# Patient Record
Sex: Male | Born: 2000 | Race: Black or African American | Hispanic: No | Marital: Single | Smoking: Never smoker
Health system: Southern US, Community
[De-identification: ages and names within clinical notes are randomized; demographics above are authoritative.]

## PROBLEM LIST (undated history)

## (undated) ENCOUNTER — Emergency Department (HOSPITAL_COMMUNITY): Discharge status: Against Medical Advice | Payer: Medicaid Other

## (undated) DIAGNOSIS — F840 Autistic disorder: Secondary | ICD-10-CM

## (undated) DIAGNOSIS — F909 Attention-deficit hyperactivity disorder, unspecified type: Secondary | ICD-10-CM

## (undated) HISTORY — PX: DENTAL SURGERY: SHX609

## (undated) HISTORY — PX: INNER EAR SURGERY: SHX679

---
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
## 2020-01-08 ENCOUNTER — Telehealth (INDEPENDENT_AMBULATORY_CARE_PROVIDER_SITE_OTHER): Payer: Medicaid Other | Admitting: Family

## 2020-01-08 DIAGNOSIS — F70 Mild intellectual disabilities: Secondary | ICD-10-CM

## 2020-01-08 DIAGNOSIS — F902 Attention-deficit hyperactivity disorder, combined type: Secondary | ICD-10-CM

## 2020-01-08 DIAGNOSIS — R634 Abnormal weight loss: Secondary | ICD-10-CM | POA: Diagnosis not present

## 2020-01-08 DIAGNOSIS — T50905A Adverse effect of unspecified drugs, medicaments and biological substances, initial encounter: Secondary | ICD-10-CM

## 2020-01-08 DIAGNOSIS — R4789 Other speech disturbances: Secondary | ICD-10-CM | POA: Diagnosis not present

## 2020-01-08 MED ORDER — GUANFACINE HCL ER 2 MG PO TB24: 2.0000 mg | ORAL_TABLET | Freq: Every day | ORAL | 2 refills | Status: DC

## 2020-01-08 MED ORDER — AMPHETAMINE-DEXTROAMPHET ER 20 MG PO CP24: 20.0000 mg | ORAL_CAPSULE | Freq: Every day | ORAL | 0 refills | Status: DC

## 2020-01-08 NOTE — Progress Notes (Signed)
THIS RECORD MAY CONTAIN CONFIDENTIAL INFORMATION THAT SHOULD NOT BE RELEASED WITHOUT REVIEW OF THE SERVICE PROVIDER.  Virtual Follow-Up Visit via Video Note  I connected with Derrick Swanson 's mother and patient  on 01/08/20 at 10:00 AM EST by a video enabled telemedicine application and verified that I am speaking with the correct person using two identifiers.   Patient/parent location: home   I discussed the limitations of evaluation and management by telemedicine and the availability of in person appointments.  I discussed that the purpose of this telehealth visit is to provide medical care while limiting exposure to the novel coronavirus.  The mother expressed understanding and agreed to proceed.   Derrick Swanson is a 19 y.o. male referred by Roselind Messier, MD here today for follow-up of ADHD.  Previsit planning completed:  yes   History was provided by the patient and mother.  Plan from Last Visit:   ADHD - adderall XR 69m weekdays. Intuniv 232mqd. Follow weight loss. New job via [employer]) Mild ID - services available.  Chief Complaint: ADHD follow up  History of Present Illness:   Connected with patient and mother Mrs WoCruz CondonADHD  - Continues to take adderall XR weekdays, Intuniv daily.  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 Mother had IEP meeting recently -- will follow up on his focus at internship. - Weight -- subjectively gaining weight (no scale), pants fitting better / increasing in size. Appetite is pretty good at night, eats well on weekends. Mornings eats grits, rice, bacon. 3 chicken tenders at lunch. Eats again at 4-5pm, again at 9pm. - Sleep: 11pm-6am. No trouble falling asleep or staying asleep. - No chest pain, heart racing, lightheadedness.  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 No complaints. - Speech class on Wedn  Scheduled to  get COVID shot soon, date unsure. Discussed SEs with mother.  ROS:  negative  Allergies  Allergen Reactions  . Amoxicillin Hives   Outpatient Medications Prior to Visit  Medication Sig Dispense Refill  . amphetamine-dextroamphetamine (ADDERALL XR) 20 MG 24 hr capsule Take 1 capsule (20 mg total) by mouth daily. qam 30 capsule 0  . guanFACINE (INTUNIV) 2 MG TB24 ER tablet Take 1 tablet (2 mg total) by mouth daily. 31 tablet 2   No facility-administered medications prior to visit.     Patient Active Problem List   Diagnosis Date Noted  . Poor weight gain (0-17) 07/30/2019  . Dysfluency 09/12/2013  . ADHD (attention deficit hyperactivity disorder), combined type 06/07/2013  . Mild intellectual disability 06/07/2013  . Receptive-expressive language delay 06/07/2013  . Weight loss due to medication 06/07/2013    Visual Observations/Objective:  General Appearance: Well nourished well developed, in no apparent distress.  Eyes: conjunctiva no swelling or erythema ENT/Mouth: No hoarseness, No cough for duration of visit.  Neck: Supple  Respiratory: Respiratory effort normal, normal rate, no retractions or distress.   Cardio: Appears well-perfused, noncyanotic Musculoskeletal: no obvious deformity Skin: visible skin without rashes, ecchymosis, erythema Neuro: Awake and oriented X 3,  Psych:  normal affect, Insight and Judgment appropriate.   Assessment/Plan:  1. ADHD (attention deficit hyperactivity disorder), combined type - amphetamine-dextroamphetamine (ADDERALL XR) 20 MG 24 hr capsule; Take 1 capsule (20 mg total) by mouth daily. qam  Dispense: 30 capsule; Refill: 0 - guanFACINE (INTUNIV) 2 MG TB24 ER tablet; Take 1 tablet (2 mg total) by  mouth daily.  Dispense: 31 tablet; Refill: 2  2. Dysfluency Receiving Speech therapy once weekly.  3. 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 Followed by Dr [name].  4. Weight loss due  to medication Subjective weight gain. No scale at home. Plan for in-office visit in 3 months for weight check.    I discussed the assessment and treatment plan with the patient and/or parent/guardian.  They were provided an opportunity to ask questions and all were answered.  They agreed with the plan and demonstrated an understanding of the instructions. They were advised to call back or seek an in-person evaluation in the emergency room if the symptoms worsen or if the condition fails to improve as anticipated.   Follow-up:   3 months in office for weight check  Medical decision-making:   I spent 20 minutes on this telehealth visit inclusive of face-to-face video and care coordination time I was located at home during this encounter.   Harlon Ditty, MD    CC: Roselind Messier, MD, Roselind Messier, MD

## 2020-01-09 ENCOUNTER — Encounter: Payer: Self-pay | Admitting: Family

## 2020-01-09 NOTE — Progress Notes (Signed)
Supervising Provider Co-Signature  I reviewed with the resident the medical history and the resident's findings.  I discussed with the resident the patient's diagnosis and concur with the treatment plan as documented in the resident's note.  Arron Mcnaught M Verneal Wiers, NP  

## 2020-02-17 ENCOUNTER — Telehealth: Payer: Self-pay

## 2020-02-17 ENCOUNTER — Other Ambulatory Visit: Payer: Self-pay | Admitting: Pediatrics

## 2020-02-17 DIAGNOSIS — F902 Attention-deficit hyperactivity disorder, combined type: Secondary | ICD-10-CM

## 2020-02-17 MED ORDER — AMPHETAMINE-DEXTROAMPHET ER 20 MG PO CP24: 20.0000 mg | ORAL_CAPSULE | Freq: Every day | ORAL | 0 refills | Status: DC

## 2020-02-17 NOTE — Telephone Encounter (Signed)
Called parent and made her aware.  

## 2020-02-17 NOTE — Telephone Encounter (Signed)
Done

## 2020-02-17 NOTE — Telephone Encounter (Signed)
Patient called asking for refill of amphetamine-dextroamphetamine (ADDERALL XR) 20 MG 24 hr capsule to [facility] [address].

## 2020-02-27 ENCOUNTER — Ambulatory Visit (INDEPENDENT_AMBULATORY_CARE_PROVIDER_SITE_OTHER): Payer: Medicaid Other | Admitting: Pediatrics

## 2020-02-27 ENCOUNTER — Encounter: Payer: Self-pay | Admitting: Family

## 2020-02-27 ENCOUNTER — Other Ambulatory Visit: Payer: Self-pay

## 2020-02-27 VITALS — BP 109/75 | HR 125 | Ht 71.06 in | Wt 145.2 lb

## 2020-02-27 DIAGNOSIS — F802 Mixed receptive-expressive language disorder: Secondary | ICD-10-CM

## 2020-02-27 DIAGNOSIS — R4789 Other speech disturbances: Secondary | ICD-10-CM | POA: Diagnosis not present

## 2020-02-27 DIAGNOSIS — F902 Attention-deficit hyperactivity disorder, combined type: Secondary | ICD-10-CM

## 2020-02-27 DIAGNOSIS — F70 Mild intellectual disabilities: Secondary | ICD-10-CM | POA: Diagnosis not present

## 2020-02-27 MED ORDER — AMPHETAMINE-DEXTROAMPHET ER 20 MG PO CP24: 20.0000 mg | ORAL_CAPSULE | Freq: Every day | ORAL | 0 refills | Status: DC

## 2020-02-27 MED ORDER — GUANFACINE HCL ER 2 MG PO TB24: 2.0000 mg | ORAL_TABLET | Freq: Every day | ORAL | 2 refills | Status: DC

## 2020-02-27 NOTE — Progress Notes (Signed)
History was provided by the patient and mother.  Derrick Swanson is a 19 y.o. male who is here for ADHD, IDD.  Roselind Messier, MD   HPI:  Pt reports that he has been working really hard to eat more. 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 He will graduate in June and they are supposed to have him something to do.    No issues or concerns with medications. Taking intuniv every day, adderall M-F and not on weekends so he can eat more.   No LMP for male patient.  Review of Systems  Constitutional: Negative for malaise/fatigue.  Eyes: Negative for double vision.  Respiratory: Negative for shortness of breath.   Cardiovascular: Negative for chest pain and palpitations.  Gastrointestinal: Negative for abdominal pain, constipation, diarrhea, nausea and vomiting.  Genitourinary: Negative for dysuria.  Musculoskeletal: Negative for joint pain and myalgias.  Skin: Negative for rash.  Neurological: Negative for dizziness and headaches.  Endo/Heme/Allergies: Does not bruise/bleed easily.    Patient Active Problem List   Diagnosis Date Noted  . Poor weight gain (0-17) 07/30/2019  . Dysfluency 09/12/2013  . ADHD (attention deficit hyperactivity disorder), combined type 06/07/2013  . Mild intellectual disability 06/07/2013  . Receptive-expressive language delay 06/07/2013  . Weight loss due to medication 06/07/2013    Current Outpatient Medications on File Prior to Visit  Medication Sig Dispense Refill  . amphetamine-dextroamphetamine (ADDERALL XR) 20 MG 24 hr capsule Take 1 capsule (20 mg total) by mouth daily. qam 30 capsule 0  . guanFACINE (INTUNIV) 2 MG TB24 ER tablet Take 1 tablet (2 mg total) by mouth daily. 31 tablet 2   No current facility-administered medications on file prior to visit.    Allergies  Allergen Reactions  . Amoxicillin Hives     Physical Exam:    Vitals:   02/27/20 0959  BP: 109/75  Pulse:  (!) 125  Weight: 145 lb 3.2 oz (65.9 kg)  Height: 5' 11.06" (1.805 m)    Blood pressure percentiles are not available for patients who are 18 years or older.  Physical Exam Constitutional:      General: He is not in acute distress.    Appearance: He is well-developed.  Neck:     Thyroid: No thyromegaly.  Cardiovascular:     Rate and Rhythm: Normal rate and regular rhythm.     Heart sounds: No murmur.  Pulmonary:     Breath sounds: Normal breath sounds.  Abdominal:     Palpations: Abdomen is soft. There is no mass.     Tenderness: There is no abdominal tenderness. There is no guarding.  Lymphadenopathy:     Cervical: No cervical adenopathy.  Skin:    General: Skin is warm.     Findings: No rash.  Neurological:     Mental Status: He is alert.     Comments: No tremor  Psychiatric:     Comments: Baseline speech dysarthria and cognitive delay      Assessment/Plan: 1. ADHD (attention deficit hyperactivity disorder), combined type Continue adderall and bedtime intuniv. This is working well for him. Not taking adderall on weekends so he can eat bette,r which is think is appropriate.  - amphetamine-dextroamphetamine (ADDERALL XR) 20 MG 24 hr capsule; Take 1 capsule (20 mg total) by mouth daily. qam  Dispense: 30 capsule; Refill: 0 - guanFACINE (INTUNIV) 2 MG TB24 ER tablet; Take 1 tablet (2  mg total) by mouth daily.  Dispense: 31 tablet; Refill: 2 - amphetamine-dextroamphetamine (ADDERALL XR) 20 MG 24 hr capsule; Take 1 capsule (20 mg total) by mouth daily with breakfast.  Dispense: 30 capsule; Refill: 0 - amphetamine-dextroamphetamine (ADDERALL XR) 20 MG 24 hr capsule; Take 1 capsule (20 mg total) by mouth daily with breakfast.  Dispense: 30 capsule; Refill: 0  2. Mild intellectual disability Continue in [employer] program and getting involved in work after graduation.   3. Receptive-expressive language delay At baseline.   4. Dysfluency At baseline. Managing well.    Alfonso Ramus, FNP

## 2020-03-11 ENCOUNTER — Ambulatory Visit (INDEPENDENT_AMBULATORY_CARE_PROVIDER_SITE_OTHER): Payer: Medicaid Other

## 2020-03-11 ENCOUNTER — Other Ambulatory Visit: Payer: Self-pay | Admitting: Pediatrics

## 2020-03-11 ENCOUNTER — Telehealth: Payer: Self-pay

## 2020-03-11 DIAGNOSIS — F902 Attention-deficit hyperactivity disorder, combined type: Secondary | ICD-10-CM

## 2020-03-11 MED ORDER — GUANFACINE HCL ER 1 MG PO TB24: 2.0000 mg | ORAL_TABLET | Freq: Every day | ORAL | 3 refills | Status: DC

## 2020-03-11 NOTE — Telephone Encounter (Signed)
Duplicate encounter - see other note. 

## 2020-03-11 NOTE — Progress Notes (Signed)
Mom called stating she [facility] that the Intuniv 2 mg tabs was dispensed to patient and is now on recall due to trace amounts of seroquel. She had only given patient 2 doses of the recalled Intuniv. She did note that he had slept more than usual but otherwise had no adverse effects. Pt took medication on April 6th and 7th and then was discontinued and was given 1 mg tabs from the pharmacy. Reassurance given and advised mom to closely monitor patient for any adverse effects and report to clinic. Mom voiced understanding and agrees to plan of care.

## 2020-03-11 NOTE — Telephone Encounter (Signed)
Mom states they did a recall on guanFACINE (INTUNIV) 2 MG TB24 ER tablet and she would like a call back so she can explain better the situation that happened at the pharmacy.

## 2020-03-11 NOTE — Telephone Encounter (Signed)
done

## 2020-03-19 ENCOUNTER — Other Ambulatory Visit: Payer: Self-pay

## 2020-03-19 NOTE — Telephone Encounter (Signed)
Called pharmacy and had them run as brand. RX went through as paid claim.

## 2020-03-19 NOTE — Telephone Encounter (Signed)
Mom states pharmacy needs extra paperwork to get amphetamine-dextroamphetamine (ADDERALL XR) 20 MG 24 hr capsule. Please call pharmacy

## 2020-05-28 ENCOUNTER — Encounter: Payer: Self-pay | Admitting: Family

## 2020-05-28 ENCOUNTER — Other Ambulatory Visit: Payer: Self-pay

## 2020-05-28 ENCOUNTER — Ambulatory Visit (INDEPENDENT_AMBULATORY_CARE_PROVIDER_SITE_OTHER): Payer: Medicaid Other | Admitting: Family

## 2020-05-28 ENCOUNTER — Encounter: Payer: Self-pay | Admitting: *Deleted

## 2020-05-28 VITALS — BP 124/80 | HR 117 | Ht 71.25 in | Wt 138.0 lb

## 2020-05-28 DIAGNOSIS — R4789 Other speech disturbances: Secondary | ICD-10-CM

## 2020-05-28 DIAGNOSIS — F70 Mild intellectual disabilities: Secondary | ICD-10-CM

## 2020-05-28 DIAGNOSIS — F902 Attention-deficit hyperactivity disorder, combined type: Secondary | ICD-10-CM | POA: Diagnosis not present

## 2020-05-28 MED ORDER — AMPHETAMINE-DEXTROAMPHET ER 20 MG PO CP24: 20.0000 mg | ORAL_CAPSULE | Freq: Every day | ORAL | 0 refills | Status: DC

## 2020-05-28 MED ORDER — GUANFACINE HCL ER 1 MG PO TB24: 2.0000 mg | ORAL_TABLET | Freq: Every day | ORAL | 3 refills | Status: DC

## 2020-05-28 NOTE — Patient Instructions (Addendum)
It was nice to see you today! Remember to drink water.  We kept your medications the same today.  I will send in a 28-month supply for you.   Take care and let us know if you have any questions or concerns before your next appointment!

## 2020-05-28 NOTE — Progress Notes (Signed)
History was provided by the patient and mother.  Derrick Swanson is a 19 y.o. male who is here for ADHD, combined type.   PCP confirmed? Yes.    [name], MD  HPI:   -Started job at [employer]; has been nervous about it  - 7-11AM  -started job June 9, had to do 16 hrs through project [employer]; does 4 hours per day  -pacing around with anxiety; was up walking  -graduated, got his diploma  -will eat in spells; now eating 2 eggs every morning; moving constantly -just doing one shift; [employer] in the afternoon -mom reminding him to drink water  Review of Systems  Constitutional: Negative for chills, fever and malaise/fatigue.  HENT: Negative for sore throat.   Eyes: Negative for blurred vision and pain.  Respiratory: Negative for shortness of breath.   Cardiovascular: Negative for chest pain and palpitations.  Gastrointestinal: Negative for abdominal pain, constipation, heartburn and nausea.  Genitourinary: Negative for dysuria and hematuria.  Musculoskeletal: Negative for myalgias.  Neurological: Negative for dizziness and headaches.  Psychiatric/Behavioral: The patient is not nervous/anxious.       Patient Active Problem List   Diagnosis Date Noted  . Dysfluency 09/12/2013  . ADHD (attention deficit hyperactivity disorder), combined type 06/07/2013  . Mild intellectual disability 06/07/2013  . Receptive-expressive language delay 06/07/2013  . Weight loss due to medication 06/07/2013    Current Outpatient Medications on File Prior to Visit  Medication Sig Dispense Refill  . amphetamine-dextroamphetamine (ADDERALL XR) 20 MG 24 hr capsule Take 1 capsule (20 mg total) by mouth daily. qam 30 capsule 0  . amphetamine-dextroamphetamine (ADDERALL XR) 20 MG 24 hr capsule Take 1 capsule (20 mg total) by mouth daily with breakfast. 30 capsule 0  . amphetamine-dextroamphetamine (ADDERALL XR) 20 MG 24 hr capsule Take 1 capsule (20 mg total) by mouth daily with breakfast. 30  capsule 0  . guanFACINE (INTUNIV) 1 MG TB24 ER tablet Take 2 tablets (2 mg total) by mouth daily. 60 tablet 3   No current facility-administered medications on file prior to visit.    Allergies  Allergen Reactions  . Amoxicillin Hives    Physical Exam:    Vitals:   05/28/20 0843  BP: 124/80  Pulse: (!) 117  Weight: 138 lb (62.6 kg)  Height: 5' 11.25" (1.81 m)   Wt Readings from Last 3 Encounters:  05/28/20 138 lb (62.6 kg) (27 %, Z= -0.62)*  02/27/20 145 lb 3.2 oz (65.9 kg) (41 %, Z= -0.24)*  07/30/19 139 lb (63 kg) (34 %, Z= -0.40)*   * Growth percentiles are based on CDC (Boys, 2-20 Years) data.   BP Readings from Last 3 Encounters:  05/28/20 124/80  02/27/20 109/75  07/30/19 (!) 130/80 (83 %, Z = 0.93 /  84 %, Z = 1.00)*   *BP percentiles are based on the 2017 AAP Clinical Practice Guideline for boys     Blood pressure percentiles are not available for patients who are 18 years or older. No LMP for male patient.  Physical Exam Vitals reviewed.  Constitutional:      Appearance: Normal appearance.  HENT:     Head: Normocephalic.     Mouth/Throat:     Mouth: Mucous membranes are moist.  Eyes:     General: No scleral icterus.    Extraocular Movements: Extraocular movements intact.     Pupils: Pupils are equal, round, and reactive to light.     Comments: Wearing corrective lenses  Cardiovascular:  Rate and Rhythm: Normal rate and regular rhythm.     Heart sounds: No murmur heard.   Pulmonary:     Effort: Pulmonary effort is normal.  Musculoskeletal:        General: No swelling. Normal range of motion.     Cervical back: Normal range of motion and neck supple.  Lymphadenopathy:     Cervical: No cervical adenopathy.  Skin:    General: Skin is warm and dry.     Capillary Refill: Capillary refill takes less than 2 seconds.     Findings: No rash.  Neurological:     Mental Status: He is alert. Mental status is at baseline.  Psychiatric:        Mood and  Affect: Mood normal.      Assessment/Plan: 1. ADHD (attention deficit hyperactivity disorder), combined type -stable on current doses -discussed weight loss and will continue to monitor -discussed alternatives to pacing, including drawing or doodling   - amphetamine-dextroamphetamine (ADDERALL XR) 20 MG 24 hr capsule; Take 1 capsule (20 mg total) by mouth daily. qam  Dispense: 30 capsule; Refill: 0 - amphetamine-dextroamphetamine (ADDERALL XR) 20 MG 24 hr capsule; Take 1 capsule (20 mg total) by mouth daily with breakfast.  Dispense: 30 capsule; Refill: 0 - amphetamine-dextroamphetamine (ADDERALL XR) 20 MG 24 hr capsule; Take 1 capsule (20 mg total) by mouth daily with breakfast.  Dispense: 30 capsule; Refill: 0  2. Mild intellectual disability -doing well in job; praise given for graduation  3. Dysfluency   Follow up in 3 months or sooner as needed.

## 2020-06-16 DIAGNOSIS — Z0271 Encounter for disability determination: Secondary | ICD-10-CM

## 2020-08-31 ENCOUNTER — Encounter: Payer: Self-pay | Admitting: Family

## 2020-08-31 ENCOUNTER — Other Ambulatory Visit: Payer: Self-pay

## 2020-08-31 ENCOUNTER — Ambulatory Visit (INDEPENDENT_AMBULATORY_CARE_PROVIDER_SITE_OTHER): Payer: Medicaid Other | Admitting: Family

## 2020-08-31 VITALS — BP 123/80 | HR 94 | Ht 71.36 in | Wt 144.6 lb

## 2020-08-31 DIAGNOSIS — G479 Sleep disorder, unspecified: Secondary | ICD-10-CM

## 2020-08-31 DIAGNOSIS — F902 Attention-deficit hyperactivity disorder, combined type: Secondary | ICD-10-CM | POA: Diagnosis not present

## 2020-08-31 DIAGNOSIS — F70 Mild intellectual disabilities: Secondary | ICD-10-CM

## 2020-08-31 MED ORDER — AMPHETAMINE-DEXTROAMPHET ER 20 MG PO CP24: 20.0000 mg | ORAL_CAPSULE | Freq: Every day | ORAL | 0 refills | Status: DC

## 2020-08-31 MED ORDER — HYDROXYZINE HCL 10 MG PO TABS: 10.0000 mg | ORAL_TABLET | Freq: Every evening | ORAL | 0 refills | Status: DC

## 2020-08-31 MED ORDER — GUANFACINE HCL ER 1 MG PO TB24: 2.0000 mg | ORAL_TABLET | Freq: Every day | ORAL | 3 refills | Status: DC

## 2020-08-31 NOTE — Progress Notes (Addendum)
History was provided by the patient and mother.  Derrick Swanson is a 19 y.o. male who is here for ADHD, combined type, mild intellectual disability, sleep issues.   PCP confirmed? Yes.    [name], MD  HPI:   -loves work at [employer] -working from [employer]  -gets home, showers, then in his room until 4-5PM when he comes out to eat, then back in his room until 1-2AM; then up at [facility] -mom endorses she would like to improve his sleep schedule; get him back to going to bed around 9-10PM; tried melatonin 5 mg did nothing, 10 mg. Nothing currently  -still paces/runs sometimes -appetite good -last vision screening: Dec 2020    Review of Systems  Constitutional: Negative for fever and weight loss.  HENT: Negative for sore throat.   Eyes: Negative for blurred vision and pain.  Respiratory: Negative for shortness of breath.   Cardiovascular: Negative for chest pain and palpitations.  Gastrointestinal: Negative for abdominal pain, constipation and nausea.  Genitourinary: Negative for dysuria.  Musculoskeletal: Negative for joint pain and myalgias.  Neurological: Negative for dizziness, tremors and headaches.  Psychiatric/Behavioral: Negative for depression and suicidal ideas. The patient is not nervous/anxious.      Patient Active Problem List   Diagnosis Date Noted   Dysfluency 09/12/2013   ADHD (attention deficit hyperactivity disorder), combined type 06/07/2013   Mild intellectual disability 06/07/2013   Receptive-expressive language delay 06/07/2013   Weight loss due to medication 06/07/2013    Current Outpatient Medications on File Prior to Visit  Medication Sig Dispense Refill   amphetamine-dextroamphetamine (ADDERALL XR) 20 MG 24 hr capsule Take 1 capsule (20 mg total) by mouth daily with breakfast. 30 capsule 0   amphetamine-dextroamphetamine (ADDERALL XR) 20 MG 24 hr capsule Take 1 capsule (20 mg total) by mouth daily with breakfast. 30 capsule 0    guanFACINE (INTUNIV) 1 MG TB24 ER tablet Take 2 tablets (2 mg total) by mouth daily. 60 tablet 3   amphetamine-dextroamphetamine (ADDERALL XR) 20 MG 24 hr capsule Take 1 capsule (20 mg total) by mouth daily. qam 30 capsule 0   No current facility-administered medications on file prior to visit.    Allergies  Allergen Reactions   Amoxicillin Hives    Physical Exam:    Vitals:   08/31/20 0833  BP: 123/80  Pulse: 94  Weight: 144 lb 9.6 oz (65.6 kg)  Height: 5' 11.36" (1.813 m)   Wt Readings from Last 3 Encounters:  08/31/20 144 lb 9.6 oz (65.6 kg) (36 %, Z= -0.35)*  05/28/20 138 lb (62.6 kg) (27 %, Z= -0.62)*  02/27/20 145 lb 3.2 oz (65.9 kg) (41 %, Z= -0.24)*   * Growth percentiles are based on CDC (Boys, 2-20 Years) data.     Blood pressure percentiles are not available for patients who are 18 years or older. No LMP for male patient.  Physical Exam Vitals reviewed.  Constitutional:      General: He is not in acute distress.    Appearance: Normal appearance.  HENT:     Head: Normocephalic.  Eyes:     Extraocular Movements: Extraocular movements intact.     Pupils: Pupils are equal, round, and reactive to light.     Comments: Wearing corrective lenses  Cardiovascular:     Rate and Rhythm: Normal rate.     Pulses: Normal pulses.  Pulmonary:     Effort: Pulmonary effort is normal.  Abdominal:     General: Abdomen is  flat. There is no distension.     Palpations: Abdomen is soft.     Tenderness: There is no abdominal tenderness.  Musculoskeletal:        General: No swelling. Normal range of motion.     Cervical back: Normal range of motion. No tenderness.  Lymphadenopathy:     Cervical: No cervical adenopathy.  Skin:    General: Skin is warm and dry.     Capillary Refill: Capillary refill takes less than 2 seconds.     Findings: No rash.  Neurological:     General: No focal deficit present.     Mental Status: He is oriented to person, place, and time. Mental  status is at baseline.  Psychiatric:        Mood and Affect: Mood normal.     PHQ-SADS Last 3 Score only 08/31/2020 05/28/2020 12/28/2017  PHQ-15 Score 0 0 0  Total GAD-7 Score 0 0 0  PHQ-9 Total Score 0 0 0    Assessment/Plan: 1. ADHD (attention deficit hyperactivity disorder), combined type 2. Mild intellectual disability 3. Sleep disturbance   19 yo A/I male with ADHD, combined type and mild intellectual disability presents with mom for medication follow-up. Doing well on Adderall XR 20 mg, Intuniv 2 mg; weight has increased since last visit; vitals reassuring. Discussed hydroxyzine 10 mg for sleep initiation. Reviewed sleep hygiene habits - avoid daytime naps, consistent pre-bedtime routine. Return in 3 months or sooner if needed; mom to call and advise RN how hydroxyzine is going after 2-3 nights and we will adjust as needed.

## 2020-08-31 NOTE — Patient Instructions (Addendum)
It was great to see you today.  We discussed trying hydroxyzine 10 mg at bedtime (9pm) for sleep.  Please let me know how this is working for you after a few nights.   I will send 23-month prescription for Adderall 20 mg to the pharmacy.

## 2020-09-03 ENCOUNTER — Telehealth: Payer: Self-pay | Admitting: Pediatrics

## 2020-09-03 NOTE — Telephone Encounter (Signed)
Mom called and stated that the medication given to the patient is working.

## 2020-10-20 ENCOUNTER — Other Ambulatory Visit: Payer: Self-pay | Admitting: Pediatrics

## 2020-10-20 ENCOUNTER — Telehealth: Payer: Self-pay

## 2020-10-20 DIAGNOSIS — F902 Attention-deficit hyperactivity disorder, combined type: Secondary | ICD-10-CM

## 2020-10-20 MED ORDER — AMPHETAMINE-DEXTROAMPHET ER 20 MG PO CP24: 20.0000 mg | ORAL_CAPSULE | Freq: Every day | ORAL | 0 refills | Status: DC

## 2020-10-20 MED ORDER — GUANFACINE HCL ER 1 MG PO TB24
2.0000 mg | ORAL_TABLET | Freq: Every day | ORAL | 3 refills | Status: DC
Start: 2020-10-20 — End: 2020-11-30

## 2020-10-20 MED ORDER — HYDROXYZINE HCL 10 MG PO TABS
10.0000 mg | ORAL_TABLET | Freq: Every evening | ORAL | 0 refills | Status: DC
Start: 2020-10-20 — End: 2020-11-30

## 2020-10-20 NOTE — Telephone Encounter (Signed)
Done

## 2020-10-20 NOTE — Telephone Encounter (Signed)
Derrick Swanson NUMBER:  [phone]   MEDICATION(S): Adderall 20mg  ER, hydroxyzine 10mg   [facility] [address]   ARE YOU CURRENTLY COMPLETELY OUT OF THE MEDICATION? :  1 pill left of each

## 2020-10-20 NOTE — Telephone Encounter (Signed)
Patient needs refill of Guanfacine, an additional refill of adderall and hydroxyzine to last until next appointment.

## 2020-11-30 ENCOUNTER — Encounter: Payer: Self-pay | Admitting: Pediatrics

## 2020-11-30 ENCOUNTER — Ambulatory Visit (INDEPENDENT_AMBULATORY_CARE_PROVIDER_SITE_OTHER): Payer: Medicaid Other | Admitting: Pediatrics

## 2020-11-30 ENCOUNTER — Other Ambulatory Visit: Payer: Self-pay

## 2020-11-30 VITALS — BP 128/86 | HR 99 | Ht 71.46 in | Wt 146.4 lb

## 2020-11-30 DIAGNOSIS — F902 Attention-deficit hyperactivity disorder, combined type: Secondary | ICD-10-CM | POA: Diagnosis not present

## 2020-11-30 DIAGNOSIS — R030 Elevated blood-pressure reading, without diagnosis of hypertension: Secondary | ICD-10-CM

## 2020-11-30 DIAGNOSIS — F70 Mild intellectual disabilities: Secondary | ICD-10-CM | POA: Diagnosis not present

## 2020-11-30 DIAGNOSIS — R634 Abnormal weight loss: Secondary | ICD-10-CM | POA: Diagnosis not present

## 2020-11-30 DIAGNOSIS — Z23 Encounter for immunization: Secondary | ICD-10-CM | POA: Diagnosis not present

## 2020-11-30 DIAGNOSIS — R079 Chest pain, unspecified: Secondary | ICD-10-CM | POA: Insufficient documentation

## 2020-11-30 MED ORDER — AMPHETAMINE-DEXTROAMPHET ER 20 MG PO CP24: 20.0000 mg | ORAL_CAPSULE | Freq: Every day | ORAL | 0 refills | Status: DC

## 2020-11-30 MED ORDER — HYDROXYZINE HCL 10 MG PO TABS: 10.0000 mg | ORAL_TABLET | Freq: Every evening | ORAL | 1 refills | Status: DC

## 2020-11-30 MED ORDER — GUANFACINE HCL ER 2 MG PO TB24: 2.0000 mg | ORAL_TABLET | Freq: Every day | ORAL | 0 refills | Status: DC

## 2020-11-30 NOTE — Progress Notes (Signed)
History was provided by the patient and mother.  Derrick Swanson is a 20 y.o. male who is here for ADHD, LD.  [name], MD   HPI:  Pt reports that he is taking is adderall and intuniv. He has taken his medications this morning- he does not take adderall on the weekends. He is eating a lot on the weekends to make up for the weekdays when he has appetite suppression. Mom does make sure that he eats breakfast in the AM but doesn't eat till 4-5 pm.   He is working at [employer]. 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 Mom says he has not gotten negative feedback.   He has had his COVID booster but needs flu vaccine today.   Just had an eye check up and got some new glasses.   Occasionally has chest pain when he comes home from work. He denies SOB or dizzy with it. It does not happen on non-work days. He denies stress or anxiety on these days. Sleeping improves the pain.   Sleeping well. Goes to bed at 10 pm. Sometimes wakes up in the middle of the night due to his dreams. He takes hydroxyzine before bed but frequently wakes up at 5 am and is up. This has been his normal routine.   No LMP for male patient.  Review of Systems  Constitutional: Negative for malaise/fatigue.  Eyes: Negative for double vision.  Respiratory: Negative for shortness of breath.   Cardiovascular: Positive for chest pain. Negative for palpitations.  Gastrointestinal: Negative for abdominal pain, constipation, diarrhea, nausea and vomiting.  Genitourinary: Negative for dysuria.  Musculoskeletal: Negative for joint pain and myalgias.  Skin: Negative for rash.  Neurological: Negative for dizziness and headaches.  Endo/Heme/Allergies: Does not bruise/bleed easily.  Psychiatric/Behavioral: Negative for depression. The patient is not nervous/anxious and does not have insomnia.     Patient Active Problem List   Diagnosis Date Noted  . Dysfluency 09/12/2013  . ADHD (attention deficit hyperactivity disorder),  combined type 06/07/2013  . Mild intellectual disability 06/07/2013  . Receptive-expressive language delay 06/07/2013  . Weight loss due to medication 06/07/2013    Current Outpatient Medications on File Prior to Visit  Medication Sig Dispense Refill  . amphetamine-dextroamphetamine (ADDERALL XR) 20 MG 24 hr capsule Take 1 capsule (20 mg total) by mouth daily with breakfast. 30 capsule 0  . guanFACINE (INTUNIV) 2 MG TB24 ER tablet Take 2 mg by mouth daily.    . hydrOXYzine (ATARAX/VISTARIL) 10 MG tablet Take 1 tablet (10 mg total) by mouth at bedtime. 60 tablet 0   No current facility-administered medications on file prior to visit.    Allergies  Allergen Reactions  . Amoxicillin Hives     Physical Exam:    Vitals:   11/30/20 0905 11/30/20 0908  BP: (!) 135/93 128/86  Pulse: (!) 115 99  Weight: 146 lb 6.4 oz (66.4 kg)   Height: 5' 11.46" (1.815 m)     Blood pressure percentiles are not available for patients who are 18 years or older.  Physical Exam Constitutional:      General: He is not in acute distress.    Appearance: He is well-developed.  Neck:     Thyroid: No thyromegaly.  Cardiovascular:     Rate and Rhythm: Normal rate and regular rhythm.     Heart sounds: No murmur heard.   Pulmonary:     Breath sounds: Normal breath sounds.  Abdominal:     Palpations:  Abdomen is soft. There is no mass.     Tenderness: There is no abdominal tenderness. There is no guarding.  Lymphadenopathy:     Cervical: No cervical adenopathy.  Skin:    General: Skin is warm.     Findings: No rash.  Neurological:     Mental Status: He is alert.     Comments: No tremor     Assessment/Plan: 1. ADHD (attention deficit hyperactivity disorder), combined type Continue same adderall dose. He is doing very well at work and continues to be successful.  - amphetamine-dextroamphetamine (ADDERALL XR) 20 MG 24 hr capsule; Take 1 capsule (20 mg total) by mouth daily with breakfast.   Dispense: 30 capsule; Refill: 0 - amphetamine-dextroamphetamine (ADDERALL XR) 20 MG 24 hr capsule; Take 1 capsule (20 mg total) by mouth daily with breakfast.  Dispense: 30 capsule; Refill: 0 - amphetamine-dextroamphetamine (ADDERALL XR) 20 MG 24 hr capsule; Take 1 capsule (20 mg total) by mouth daily with breakfast.  Dispense: 30 capsule; Refill: 0 - guanFACINE (INTUNIV) 2 MG TB24 ER tablet; Take 1 tablet (2 mg total) by mouth daily.  Dispense: 90 tablet; Refill: 0 - hydrOXYzine (ATARAX/VISTARIL) 10 MG tablet; Take 1 tablet (10 mg total) by mouth at bedtime.  Dispense: 90 tablet; Refill: 1  2. Weight loss due to medication Continues with good weight improvements today.   3. Mild intellectual disability Is employed and working 16 hours a week which coach feels like is the right number at this time.   4. Elevated BP without diagnosis of hypertension Likely d/t adderall, but will monitor.   5. Chest pain, unspecified type Given CP, history of tachycardia in the office, we will get EKG to eval for any underlying cardiac cause. No red flags today and heart sounds normal.  - EKG 12-Lead  6. Needs flu shot Per pt request.  - Flu Vaccine QUAD 36+ mos IM  Return in 3 months or sooner pending EKG   Alfonso Ramus, FNP

## 2020-11-30 NOTE — Patient Instructions (Addendum)
Call EKG [phone]- call to schedule  Flu shot today  Let us know if chest pain is getting worse

## 2020-12-02 ENCOUNTER — Other Ambulatory Visit: Payer: Self-pay

## 2020-12-02 ENCOUNTER — Ambulatory Visit (HOSPITAL_COMMUNITY)
Admission: RE | Admit: 2020-12-02 | Discharge: 2020-12-02 | Disposition: A | Discharge status: Home / Self Care | Payer: Medicaid Other | Source: Ambulatory Visit | Attending: Pediatrics | Admitting: Pediatrics

## 2020-12-02 DIAGNOSIS — R079 Chest pain, unspecified: Secondary | ICD-10-CM | POA: Insufficient documentation

## 2021-03-01 ENCOUNTER — Ambulatory Visit (INDEPENDENT_AMBULATORY_CARE_PROVIDER_SITE_OTHER): Payer: Medicaid Other | Admitting: Pediatrics

## 2021-03-01 ENCOUNTER — Other Ambulatory Visit: Payer: Self-pay

## 2021-03-01 ENCOUNTER — Encounter: Payer: Self-pay | Admitting: Pediatrics

## 2021-03-01 VITALS — BP 128/82 | HR 107 | Ht 71.36 in | Wt 146.6 lb

## 2021-03-01 DIAGNOSIS — G479 Sleep disorder, unspecified: Secondary | ICD-10-CM | POA: Diagnosis not present

## 2021-03-01 DIAGNOSIS — F70 Mild intellectual disabilities: Secondary | ICD-10-CM

## 2021-03-01 DIAGNOSIS — F902 Attention-deficit hyperactivity disorder, combined type: Secondary | ICD-10-CM | POA: Diagnosis not present

## 2021-03-01 MED ORDER — AMPHETAMINE-DEXTROAMPHET ER 20 MG PO CP24: 20.0000 mg | ORAL_CAPSULE | Freq: Every day | ORAL | 0 refills | Status: DC

## 2021-03-01 MED ORDER — HYDROXYZINE HCL 25 MG PO TABS
25.0000 mg | ORAL_TABLET | Freq: Every day | ORAL | 1 refills | Status: DC
Start: 2021-03-01 — End: 2021-06-22

## 2021-03-01 MED ORDER — GUANFACINE HCL ER 3 MG PO TB24: 3.0000 mg | ORAL_TABLET | Freq: Every day | ORAL | 1 refills | Status: DC

## 2021-03-01 NOTE — Progress Notes (Signed)
History was provided by the patient and mother.  Derrick Swanson is a 20 y.o. male who is here for ADHD, LD, sleep.  [name], MD   HPI:  Pt reports that he has been doing well. He continues to work which is going well. No more chest pain. Reports he has been eating well. He has been sleeping well, though mom says off and on. He gets up at 5 am without an alarm clock and is often very hyperactive in the mornings. Mom tells him to put his pants on and go outside to run around, but he usually doesn't want to. She continues to give him hydroxyzine for sleep in the PM- 20 mg. Needs refill.   PHQ-SADS Last 3 Score only 03/01/2021 08/31/2020 05/28/2020  PHQ-15 Score 0 0 0  Total GAD-7 Score 0 0 0  PHQ-9 Total Score 0 0 0     No LMP for male patient.  Patient Active Problem List   Diagnosis Date Noted  . Elevated BP without diagnosis of hypertension 11/30/2020  . Chest pain 11/30/2020  . Dysfluency 09/12/2013  . ADHD (attention deficit hyperactivity disorder), combined type 06/07/2013  . Mild intellectual disability 06/07/2013  . Receptive-expressive language delay 06/07/2013  . Weight loss due to medication 06/07/2013    Current Outpatient Medications on File Prior to Visit  Medication Sig Dispense Refill  . amphetamine-dextroamphetamine (ADDERALL XR) 20 MG 24 hr capsule Take 1 capsule (20 mg total) by mouth daily with breakfast. 30 capsule 0  . amphetamine-dextroamphetamine (ADDERALL XR) 20 MG 24 hr capsule Take 1 capsule (20 mg total) by mouth daily with breakfast. 30 capsule 0  . amphetamine-dextroamphetamine (ADDERALL XR) 20 MG 24 hr capsule Take 1 capsule (20 mg total) by mouth daily with breakfast. 30 capsule 0  . guanFACINE (INTUNIV) 2 MG TB24 ER tablet Take 1 tablet (2 mg total) by mouth daily. 90 tablet 0  . hydrOXYzine (ATARAX/VISTARIL) 10 MG tablet Take 1 tablet (10 mg total) by mouth at bedtime. 90 tablet 1   No current facility-administered medications on file prior to  visit.    Allergies  Allergen Reactions  . Amoxicillin Hives     Physical Exam:    Vitals:   03/01/21 0858  BP: 128/82  Pulse: (!) 107  Weight: 146 lb 9.6 oz (66.5 kg)  Height: 5' 11.36" (1.813 m)    Blood pressure percentiles are not available for patients who are 18 years or older.  Physical Exam Constitutional:      General: He is not in acute distress.    Appearance: He is well-developed.  Neck:     Thyroid: No thyromegaly.  Cardiovascular:     Rate and Rhythm: Normal rate and regular rhythm.     Heart sounds: No murmur heard.   Pulmonary:     Breath sounds: Normal breath sounds.  Abdominal:     Palpations: Abdomen is soft. There is no mass.     Tenderness: There is no abdominal tenderness. There is no guarding.  Lymphadenopathy:     Cervical: No cervical adenopathy.  Skin:    General: Skin is warm.     Findings: No rash.  Neurological:     General: No focal deficit present.     Mental Status: He is alert. Mental status is at baseline.     Comments: No tremor  Psychiatric:        Mood and Affect: Mood and affect normal.     Comments: Speech dysfluency at  baseline     Assessment/Plan: 1. ADHD (attention deficit hyperactivity disorder), combined type Continue adderall on weekdays. Will increase intuniv dose to 3 mg to see if this helps with AM hyperactivity at all.  - GuanFACINE HCl (INTUNIV) 3 MG TB24; Take 1 tablet (3 mg total) by mouth at bedtime.  Dispense: 90 tablet; Refill: 1 - amphetamine-dextroamphetamine (ADDERALL XR) 20 MG 24 hr capsule; Take 1 capsule (20 mg total) by mouth daily with breakfast.  Dispense: 30 capsule; Refill: 0 - amphetamine-dextroamphetamine (ADDERALL XR) 20 MG 24 hr capsule; Take 1 capsule (20 mg total) by mouth daily with breakfast.  Dispense: 30 capsule; Refill: 0 - amphetamine-dextroamphetamine (ADDERALL XR) 20 MG 24 hr capsule; Take 1 capsule (20 mg total) by mouth daily with breakfast.  Dispense: 30 capsule; Refill:  0  2. Mild intellectual disability At baseline. Is working and doing well with job Stage manager.   3. Sleep disturbance Continue hydroxyzine. Discussed sleep hygiene.  - hydrOXYzine (ATARAX/VISTARIL) 25 MG tablet; Take 1 tablet (25 mg total) by mouth at bedtime.  Dispense: 90 tablet; Refill: 1  Return in 3 months or sooner as needed.   Alfonso Ramus, FNP

## 2021-03-01 NOTE — Patient Instructions (Signed)
Increase intuniv to 3 mg nightly  Continue hydroxyzine- now 1 pill  Continue adderall

## 2021-06-04 ENCOUNTER — Other Ambulatory Visit: Payer: Self-pay | Admitting: Pediatrics

## 2021-06-04 ENCOUNTER — Telehealth: Payer: Self-pay | Admitting: Pediatrics

## 2021-06-04 DIAGNOSIS — F902 Attention-deficit hyperactivity disorder, combined type: Secondary | ICD-10-CM

## 2021-06-04 MED ORDER — AMPHETAMINE-DEXTROAMPHET ER 20 MG PO CP24: 20.0000 mg | ORAL_CAPSULE | Freq: Every day | ORAL | 0 refills | Status: DC

## 2021-06-04 NOTE — Telephone Encounter (Signed)
Refill sent.

## 2021-06-04 NOTE — Telephone Encounter (Signed)
Patient is requesting refill for amphetamine-dextroamphetamine (ADDERALL XR) 20 MG 24 hr capsule  His appointment for 06/07/21 was rescheduled to 7/ 26/22 due to the provider being out and he will be out of meds soon . Call back number is [phone]   PREFERRED PHARMACY: [id number] - [place], [place] - [address]

## 2021-06-07 ENCOUNTER — Ambulatory Visit: Payer: Self-pay | Admitting: Pediatrics

## 2021-06-14 ENCOUNTER — Telehealth: Payer: Self-pay | Admitting: Pediatrics

## 2021-06-14 NOTE — Telephone Encounter (Signed)
Mom states pt will be out of Adderral meds tomorrow and appt is not until the 26th of this month. She needs a refill and would like a call back with details.

## 2021-06-17 ENCOUNTER — Other Ambulatory Visit: Payer: Self-pay | Admitting: Family

## 2021-06-22 ENCOUNTER — Other Ambulatory Visit: Payer: Self-pay

## 2021-06-22 ENCOUNTER — Ambulatory Visit (INDEPENDENT_AMBULATORY_CARE_PROVIDER_SITE_OTHER): Payer: Medicaid Other | Admitting: Pediatrics

## 2021-06-22 VITALS — BP 103/72 | HR 108 | Ht 71.26 in | Wt 146.0 lb

## 2021-06-22 DIAGNOSIS — F70 Mild intellectual disabilities: Secondary | ICD-10-CM

## 2021-06-22 DIAGNOSIS — F902 Attention-deficit hyperactivity disorder, combined type: Secondary | ICD-10-CM | POA: Diagnosis not present

## 2021-06-22 DIAGNOSIS — G479 Sleep disorder, unspecified: Secondary | ICD-10-CM | POA: Diagnosis not present

## 2021-06-22 DIAGNOSIS — R030 Elevated blood-pressure reading, without diagnosis of hypertension: Secondary | ICD-10-CM | POA: Diagnosis not present

## 2021-06-22 MED ORDER — AMPHETAMINE-DEXTROAMPHET ER 20 MG PO CP24: 20.0000 mg | ORAL_CAPSULE | Freq: Every day | ORAL | 0 refills | Status: DC

## 2021-06-22 MED ORDER — GUANFACINE HCL ER 3 MG PO TB24: 3.0000 mg | ORAL_TABLET | Freq: Every day | ORAL | 1 refills | Status: DC

## 2021-06-22 MED ORDER — HYDROXYZINE HCL 25 MG PO TABS: 25.0000 mg | ORAL_TABLET | Freq: Every day | ORAL | 1 refills | Status: DC

## 2021-06-22 NOTE — Progress Notes (Signed)
History was provided by the patient and mother.  Derrick Swanson is a 20 y.o. male who is here for ADHD, learning differences, sleep disturbance.  [name], MD   HPI:  Pt reports that he has been doing really well. He has been working at [employer] and is doing well there with no concerns.   Reports he is sleeping well at night. Mom says that sometimes he is up in the night if something wakes him up.   He hasn't been in for a well child visit in over 2 years due to fears about someone checking his genitals. We discussed that it is his right to decline this and why we check those parts. He would be more comfortable with male provider.   PHQ-SADS Last 3 Score only 06/22/2021 03/01/2021 08/31/2020  PHQ-15 Score 0 0 0  Total GAD-7 Score 0 0 0  PHQ-9 Total Score 0 0 0     No LMP for male patient.   Patient Active Problem List   Diagnosis Date Noted   Sleep disturbance 03/01/2021   Elevated BP without diagnosis of hypertension 11/30/2020   Chest pain 11/30/2020   Dysfluency 09/12/2013   ADHD (attention deficit hyperactivity disorder), combined type 06/07/2013   Mild intellectual disability 06/07/2013   Receptive-expressive language delay 06/07/2013   Weight loss due to medication 06/07/2013    Current Outpatient Medications on File Prior to Visit  Medication Sig Dispense Refill   amphetamine-dextroamphetamine (ADDERALL XR) 20 MG 24 hr capsule Take 1 capsule (20 mg total) by mouth daily with breakfast. 30 capsule 0   amphetamine-dextroamphetamine (ADDERALL XR) 20 MG 24 hr capsule Take 1 capsule (20 mg total) by mouth daily with breakfast. 30 capsule 0   amphetamine-dextroamphetamine (ADDERALL XR) 20 MG 24 hr capsule Take 1 capsule (20 mg total) by mouth daily with breakfast. 30 capsule 0   GuanFACINE HCl (INTUNIV) 3 MG TB24 Take 1 tablet (3 mg total) by mouth at bedtime. 90 tablet 1   hydrOXYzine (ATARAX/VISTARIL) 25 MG tablet Take 1 tablet (25 mg total) by mouth at bedtime. 90  tablet 1   No current facility-administered medications on file prior to visit.    Allergies  Allergen Reactions   Amoxicillin Hives    Physical Exam:    Vitals:   06/22/21 1438  BP: 103/72  Pulse: (!) 108  Weight: 146 lb (66.2 kg)  Height: 5' 11.26" (1.81 m)    Blood pressure percentiles are not available for patients who are 18 years or older.  Physical Exam Constitutional:      General: He is not in acute distress.    Appearance: He is well-developed.  Neck:     Thyroid: No thyromegaly.  Cardiovascular:     Rate and Rhythm: Normal rate and regular rhythm.     Heart sounds: No murmur heard. Pulmonary:     Breath sounds: Normal breath sounds.  Abdominal:     Palpations: Abdomen is soft. There is no mass.     Tenderness: There is no abdominal tenderness. There is no guarding.  Lymphadenopathy:     Cervical: No cervical adenopathy.  Skin:    General: Skin is warm.     Findings: No rash.  Neurological:     Mental Status: He is alert.     Comments: No tremor  Psychiatric:        Mood and Affect: Mood is anxious.    Assessment/Plan: 1. ADHD (attention deficit hyperactivity disorder), combined type Continue adderall xr and  guanfacine daily. He is doing really well.  - amphetamine-dextroamphetamine (ADDERALL XR) 20 MG 24 hr capsule; Take 1 capsule (20 mg total) by mouth daily with breakfast.  Dispense: 30 capsule; Refill: 0 - amphetamine-dextroamphetamine (ADDERALL XR) 20 MG 24 hr capsule; Take 1 capsule (20 mg total) by mouth daily with breakfast.  Dispense: 30 capsule; Refill: 0 - amphetamine-dextroamphetamine (ADDERALL XR) 20 MG 24 hr capsule; Take 1 capsule (20 mg total) by mouth daily with breakfast.  Dispense: 30 capsule; Refill: 0 - GuanFACINE HCl (INTUNIV) 3 MG TB24; Take 1 tablet (3 mg total) by mouth at bedtime.  Dispense: 90 tablet; Refill: 1  2. Mild intellectual disability Education provided about well child check/adult check today. He will return and  see a male provider in a few months and be able to get his flu shot at that point as well.   3. Elevated BP without diagnosis of hypertension Improved today.   4. Sleep disturbance Overall better, hydroxyzine is helpful.  - hydrOXYzine (ATARAX/VISTARIL) 25 MG tablet; Take 1 tablet (25 mg total) by mouth at bedtime.  Dispense: 90 tablet; Refill: 1  Will see him in about 5 months with [facility] in between.   Alfonso Ramus, FNP

## 2021-08-10 ENCOUNTER — Other Ambulatory Visit: Payer: Self-pay

## 2021-08-10 ENCOUNTER — Encounter: Payer: Self-pay | Admitting: Pediatrics

## 2021-08-10 ENCOUNTER — Other Ambulatory Visit (HOSPITAL_COMMUNITY)
Admission: RE | Admit: 2021-08-10 | Discharge: 2021-08-10 | Disposition: A | Discharge status: Home / Self Care | Payer: Medicaid Other | Source: Ambulatory Visit | Attending: Pediatrics | Admitting: Pediatrics

## 2021-08-10 ENCOUNTER — Ambulatory Visit (INDEPENDENT_AMBULATORY_CARE_PROVIDER_SITE_OTHER): Payer: Medicaid Other | Admitting: Pediatrics

## 2021-08-10 VITALS — BP 104/60 | HR 113 | Ht 71.11 in | Wt 147.2 lb

## 2021-08-10 DIAGNOSIS — R4789 Other speech disturbances: Secondary | ICD-10-CM

## 2021-08-10 DIAGNOSIS — H6123 Impacted cerumen, bilateral: Secondary | ICD-10-CM

## 2021-08-10 DIAGNOSIS — F902 Attention-deficit hyperactivity disorder, combined type: Secondary | ICD-10-CM

## 2021-08-10 DIAGNOSIS — Z113 Encounter for screening for infections with a predominantly sexual mode of transmission: Secondary | ICD-10-CM | POA: Insufficient documentation

## 2021-08-10 DIAGNOSIS — Z114 Encounter for screening for human immunodeficiency virus [HIV]: Secondary | ICD-10-CM | POA: Diagnosis not present

## 2021-08-10 DIAGNOSIS — G479 Sleep disorder, unspecified: Secondary | ICD-10-CM

## 2021-08-10 DIAGNOSIS — F70 Mild intellectual disabilities: Secondary | ICD-10-CM

## 2021-08-10 DIAGNOSIS — Z0001 Encounter for general adult medical examination with abnormal findings: Secondary | ICD-10-CM | POA: Diagnosis not present

## 2021-08-10 DIAGNOSIS — Z7689 Persons encountering health services in other specified circumstances: Secondary | ICD-10-CM

## 2021-08-10 DIAGNOSIS — R94120 Abnormal auditory function study: Secondary | ICD-10-CM | POA: Diagnosis not present

## 2021-08-10 DIAGNOSIS — Z6820 Body mass index (BMI) 20.0-20.9, adult: Secondary | ICD-10-CM | POA: Diagnosis not present

## 2021-08-10 LAB — POCT RAPID HIV: Rapid HIV, POC: NEGATIVE

## 2021-08-10 NOTE — Patient Instructions (Addendum)
Adult Primary Care Clinics Name Criteria Services   [facility]  Address: [address] [place], [place] [address]  Phone: [phone] Hours: Monday - Friday 9 AM -6 PM  Types of insurance accepted:  Commercial insurance [facility] (orange card) Medicaid Medicare Uninsured  Language services:  Video and phone interpreters available   Ages 18 and older    Adult primary care Onsite pharmacy Integrated behavioral health Financial assistance counseling Walk-in hours for established patients  Financial assistance counseling hours: Tuesdays 2:00PM - 5:00PM  Thursday 8:30AM - 4:30PM  Space is limited, 10 on Tuesday and 20 on Thursday. It's on first come first serve basis  Name Criteria Services   [facility]  Address: [address] [place], [place] [address]  Phone: [phone]  Hours: Monday - Friday 8:30 AM - 5 PM  Types of insurance accepted:  Commercial insurance Medicaid Medicare Uninsured  Language services:  Video and phone interpreters available   All ages - newborn to adult   Primary care for all ages (children and adults) Integrated behavioral health Nutritionist Financial assistance counseling   Name Criteria Services   [facility]  Located on the ground floor of [facility]  Address: [address]  [place],  [place]  [address]  Phone: [phone]  Hours: Monday - Friday 8:15 AM - 5 PM  Types of insurance accepted:  Commercial insurance Medicaid Medicare Uninsured  Language services:  Video and phone interpreters available   Ages 18 and older   Adult primary care Nutritionist Certified Diabetes Educator  Integrated behavioral health Financial assistance counseling   Name Criteria Services   [facility]  Address: [address] [place], [place] [address]  Phone:  [phone]  Hours: Monday - Friday 8:30 AM - 5 PM    Types of insurance accepted:  Commercial insurance Medicaid Medicare Uninsured  Language services:  Video and phone interpreters available   All ages - newborn to adult   Primary care for all ages (children and adults) Integrated behavioral health Financial assistance counseling    

## 2021-08-10 NOTE — Progress Notes (Signed)
Adolescent Well Care Visit Derrick Swanson is a 20 y.o. male who is here for well care.    PCP:  [name], MD   History was provided by the patient and mother.  Current Issues: Current concerns include Last well care 06/2019--Sebastiano reported avoiding genital exam as reason for lack of well care at recent visit for ADHD, 05/2021.  Mom total knee replacement--has limited range of motion  Patient Active Problem List   Diagnosis Date Noted   Sleep disturbance 03/01/2021   Elevated BP without diagnosis of hypertension 11/30/2020   Dysfluency 09/12/2013   ADHD (attention deficit hyperactivity disorder), combined type 06/07/2013   Mild intellectual disability 06/07/2013   Receptive-expressive language delay 06/07/2013   Hearing loss: non in the past, get his ears plugged with wax   Guardian: mom,   Works: [employer] 4 day a week, [place], been there about one year.   Nutrition: Nutrition/Eating Behaviors: eats all day  long when off adderall on weekend Adequate calcium in diet?: multivit,  Supplements/ Vitamins: loves milk   Exercise/ Media: Play any Sports?/ Exercise: work, very active, no organized sports or intentional exercise Screen Time:   not all the times.  Media Rules or Monitoring?: likes the shooting games, spends a lot of time on them. Mom doesn't fight that battle  Sleep:  Sleep: uses hydroxyzine every night Gets up at 5 am, bed time is 10-11 Melatonin --no longer using  Social Screening: Lives with:  Mom and Scientific laboratory technician, Parental relations:  good Activities, Work, and Regulatory affairs officer?: action figures Concerns regarding behavior with peers?  Gets along well boss and other employee,  Stressors of note: mom's limited mobility  Has a work and Child psychotherapist through [employer]- at this point they check on him at work once a month Also has tried [facility] once, but was late to work and Scientific laboratory technician didn't want to be late to work. Will try again.   Education: No longer involved  with school district No therapies  Confidential Social History: Tobacco?  no Secondhand smoke exposure?  no Drugs/ETOH?  no  Sexually Active?  no   Pregnancy Prevention: none  Safe at home, in work & in relationships?  Yes Safe to self?  No -      Screenings: Patient has a dental home:  yes, does not clean his teeth well  The patient completed the Rapid Assessment for Adolescent Preventive Services screening questionnaire and the following topics were identified as risk factors and discussed: healthy eating  In addition, the following topics were discussed as part of anticipatory guidance exercise.  PHQ-9 completed and results indicated low risk score of 0  Physical Exam:  Vitals:   08/10/21 1410  BP: 104/60  Pulse: (!) 113  SpO2: 98%  Weight: 147 lb 3.2 oz (66.8 kg)  Height: 5' 11.11" (1.806 m)   BP 104/60 (BP Location: Right Arm, Patient Position: Sitting)   Pulse (!) 113   Ht 5' 11.11" (1.806 m)   Wt 147 lb 3.2 oz (66.8 kg)   SpO2 98%   BMI 20.47 kg/m  Body mass index: body mass index is 20.47 kg/m. Growth percentile SmartLinks can only be used for patients less than 64 years old.  Hearing Screening   500Hz  1000Hz  2000Hz  4000Hz   Right ear Fail Fail 20 20  Left ear 20 20 20 20    Vision Screening   Right eye Left eye Both eyes  Without correction     With correction 20/20 20/20 20/20  Comments: With glasses   General Appearance:   alert, oriented, no acute distress, stuttering  HENT: Normocephalic, no obvious abnormality, conjunctiva clear, TM bilaterally occluded with dry wax  Mouth:   Normal appearing teeth, no obvious discoloration, dental caries, or dental caps  Neck:   Supple; thyroid: no enlargement, symmetric, no tenderness/mass/nodules  Chest Normal male  Lungs:   Clear to auscultation bilaterally, normal work of breathing  Heart:   Regular rate and rhythm, S1 and S2 normal, no murmurs;   Abdomen:   Soft, non-tender, no mass, or organomegaly  GU  genitalia not examined  Musculoskeletal:   Tone and strength strong and symmetrical, all extremities               Lymphatic:   No cervical adenopathy  Skin/Hair/Nails:   Skin warm, dry and intact, no rashes, no bruises or petechiae  Neurologic:   Strength, gait, and coordination normal and age-appropriate     Assessment and Plan:   1. Encounter for general adult medical examination with abnormal findings No longer high BP reading when less worried about exam, still with high pulse  2. Routine screening for STI (sexually transmitted infection)  - Urine cytology ancillary only - POCT Rapid HIV-neg  3. BMI 20.0-20.9, adult Normal, Reviewed healthy diet choices  4. Encounter for planning transition from pediatric to adult care provider Has a guardian  - Ambulatory referral to [facility]  5. Dysfluency  6. Sleep disturbance Using nightly hydroxyzine  7. ADHD (attention deficit hyperactivity disorder), combined type Continue iwht Adderal as previously prescribed  8. Mild intellectual disability In a supported work environment  9. Bilateral impacted cerumen Did not discuss cleanning in visit, sent mychar twith instructions  10. Failed hearing screening Recheck after ear wax removed   BMI is appropriate for age  Hearing screening result: failed due to impacted wax, will try to clean a repeat Vision screening result: normal   [name], MD

## 2021-08-11 LAB — URINE CYTOLOGY ANCILLARY ONLY
Chlamydia: NEGATIVE
Comment: NEGATIVE
Comment: NORMAL
Neisseria Gonorrhea: NEGATIVE

## 2021-08-13 ENCOUNTER — Encounter: Payer: Self-pay | Admitting: Pediatrics

## 2021-08-13 ENCOUNTER — Other Ambulatory Visit: Payer: Self-pay

## 2021-08-13 ENCOUNTER — Ambulatory Visit (INDEPENDENT_AMBULATORY_CARE_PROVIDER_SITE_OTHER): Payer: Medicaid Other | Admitting: Pediatrics

## 2021-08-13 VITALS — Wt 149.2 lb

## 2021-08-13 DIAGNOSIS — H6123 Impacted cerumen, bilateral: Secondary | ICD-10-CM

## 2021-08-13 DIAGNOSIS — R94120 Abnormal auditory function study: Secondary | ICD-10-CM

## 2021-08-13 MED ORDER — CARBAMIDE PEROXIDE 6.5 % OT SOLN
5.0000 [drp] | Freq: Once | OTIC | Status: AC
  Administered 2021-08-13: 5 [drp] via OTIC

## 2021-08-13 NOTE — Progress Notes (Signed)
   Subjective:    Derrick Swanson, is a 20 y.o. male   Chief Complaint  Patient presents with   EAR CONCERN   History provider by mother Interpreter: no  HPI:  CMA's notes and vital signs have been reviewed  New Concern #1 Onset of symptoms:   Need wax removed from ears. He did not pass the hearing test at his [facility] 08/10/21, failed lower frequencies in right ear. He has noticed decreased hearing in his right ear.  No recent illness/respiratory symptoms.   Medications:   Current Outpatient Medications:    amphetamine-dextroamphetamine (ADDERALL XR) 20 MG 24 hr capsule, Take 1 capsule (20 mg total) by mouth daily with breakfast., Disp: 30 capsule, Rfl: 0   amphetamine-dextroamphetamine (ADDERALL XR) 20 MG 24 hr capsule, Take 1 capsule (20 mg total) by mouth daily with breakfast., Disp: 30 capsule, Rfl: 0   [START ON 09/08/2021] amphetamine-dextroamphetamine (ADDERALL XR) 20 MG 24 hr capsule, Take 1 capsule (20 mg total) by mouth daily with breakfast., Disp: 30 capsule, Rfl: 0   GuanFACINE HCl (INTUNIV) 3 MG TB24, Take 1 tablet (3 mg total) by mouth at bedtime., Disp: 90 tablet, Rfl: 1   hydrOXYzine (ATARAX/VISTARIL) 25 MG tablet, Take 1 tablet (25 mg total) by mouth at bedtime., Disp: 90 tablet, Rfl: 1  Current Facility-Administered Medications:    carbamide peroxide (DEBROX) 6.5 % OTIC (EAR) solution 5 drop, 5 drop, Both EARS, Once, Elaria Osias, Jonathon Jordan, NP    Review of Systems  Constitutional: Negative.   HENT:  Positive for hearing loss. Negative for congestion and ear pain.   Psychiatric/Behavioral:         Autism ADHD    Patient's history was reviewed and updated as appropriate: allergies, medications, and problem list.       has ADHD (attention deficit hyperactivity disorder), combined type; Mild intellectual disability; Receptive-expressive language delay; Dysfluency; Elevated BP without diagnosis of hypertension; and Sleep disturbance on their problem  list. Objective:     Wt 149 lb 3.2 oz (67.7 kg)   BMI 20.74 kg/m   General Appearance:  well developed, well nourished, in no distress, alert, and cooperative Head/face:  Normocephalic, atraumatic,  Eyes:  No gross abnormalities., Ears:  canals and TMs covered with cerumen bilaterally Nose/Sinuses:  no congestion  Lungs:  Normal expansion.   Neurologic:   alert, normal speech, Psych exam:appropriate affect and behavior,       Assessment & Plan:   1. Bilateral impacted cerumen Hard pack cerumen deep in ear canal which we have done a combination of debrox ear lavage and removal with ear spoon on both ears.  Re-treatment with debrox to help soften ear was that did not come out with ear lavage or use of ear spoon and lavage again.  Patient tolerated well.  Left ear canal now completed clear of cerumen.  TM - left intact.   Right ear canal -cleared all cerumen , canal mildly erythematous but TM is intact. - carbamide peroxide (DEBROX) 6.5 % OTIC (EAR) solution 5 drop - Ear Lavage  >20 minutes of direct care to lavage and remove cerumen during this visit.   Supportive care and return precautions reviewed.  2. Failed hearing screening Re-screen hearing of right ear - passed hearing screen today  Follow up:  None planned, return precautions if symptoms not improving/resolving.    Pixie Casino MSN, CPNP, CDE

## 2021-08-13 NOTE — Patient Instructions (Signed)
Symptoms of impaction Earwax, or cerumen, is a self-cleaning agent produced your body produces. It collects dirt, bacteria, and other debris. Usually, the wax works its way out of the ears naturally through chewing and other jaw motions. Many people never need to clean their ears. Sometimes, though, wax can build up and affect your hearing. When earwax reaches this level, it's called impaction. If you have impaction, you may experience symptoms like: aching in the affected ear  fullness or ringing in the ear  impaired hearing in the affected ear  an odor coming from the affected ear  dizziness  a cough You may be more likely to develop excess wax if your use hearing aids or ear plugs. Older adults and people with developmental disabilities are also at higher risk. Your ear canal's shape may make the natural removal of wax difficult. Best practices The safest way to remove wax buildup from your ears is to visit your doctor. At your appointment, your doctor can use special instruments, like a cerumen spoon, forceps, or suction device, to clear the blockage. Many offices also offer professional irrigation.  If you choose to try to remove wax at home, the following are the safest methods to try on your own: Damp cloth Cotton swabs may push wax deeper into the ear canal. Use cotton swabs only on the outside of your ear or, better yet, try wiping the area with a warm, damp washcloth. Earwax softener Many pharmacies sell over-the-counter eardrops that soften wax. These drops are typically a solution. They may contain:  mineral oil  baby oil  glycerin  peroxide  hydrogen peroxide  saline Place the specified number of drops into your ear, wait a certain amount of time, and then drain or rinse out your ear. Always follow the instructions on the package. Call your doctor if your symptoms continue after treatment. Learn more: How to use ear drops  Syringe You may also choose to irrigate your ears  using a syringe. In this process, you'll gently rinse out the ear canal using water or a saline solution. This method is often more effective if you first use some type of wax softener 15 to 30 minutes before irrigating.  It's best to warm the solution to your body temperature to avoid dizziness. Safe ways to remove earwax Ask your doctor to remove the wax in their office. Clean the outside of your ear with a damp cloth. If you choose to use cotton swabs, don't insert them into the ear canal. You can use earwax softener to soften earwax for easier removal. You can use a syringe to irrigate your ears.

## 2021-09-20 ENCOUNTER — Ambulatory Visit (INDEPENDENT_AMBULATORY_CARE_PROVIDER_SITE_OTHER): Payer: Medicaid Other | Admitting: Student

## 2021-09-20 ENCOUNTER — Other Ambulatory Visit: Payer: Self-pay

## 2021-09-20 ENCOUNTER — Encounter: Payer: Self-pay | Admitting: Student

## 2021-09-20 ENCOUNTER — Encounter: Payer: Self-pay | Admitting: Pediatrics

## 2021-09-20 VITALS — BP 110/70 | HR 113 | Ht 73.0 in | Wt 150.0 lb

## 2021-09-20 DIAGNOSIS — G479 Sleep disorder, unspecified: Secondary | ICD-10-CM

## 2021-09-20 DIAGNOSIS — Z23 Encounter for immunization: Secondary | ICD-10-CM

## 2021-09-20 DIAGNOSIS — R030 Elevated blood-pressure reading, without diagnosis of hypertension: Secondary | ICD-10-CM | POA: Diagnosis present

## 2021-09-20 DIAGNOSIS — Z7187 Encounter for pediatric-to-adult transition counseling: Secondary | ICD-10-CM | POA: Insufficient documentation

## 2021-09-20 NOTE — Assessment & Plan Note (Signed)
Takes hydroxyzine nightly, Adderall and guanfacine in AM.

## 2021-09-20 NOTE — Assessment & Plan Note (Signed)
BP appropriate today 110/70.

## 2021-09-20 NOTE — Progress Notes (Signed)
New Patient Office Visit  Subjective:  Patient ID: Derrick Swanson, male    DOB: 09/24/2001  Age: 20 y.o. MRN: [id number]  CC:  Chief Complaint  Patient presents with   New Patient (Initial Visit)    HPI Derrick Swanson presents for new patient establishing care. He has an appointment on November 29th with [facility] to refill his ADHD medications and then we will take over medication refills. No complaints or refills needed today. Patient has stutter that he had gone to speech therapy for during schooling. Takes adderall during weekdays in morning 20 mg. Takes guanfacine daily. Takes hydroxyzine at night daily. Has had chronic issue staying asleep.  Past Medical History:  Diagnosis Date   ADHD (attention deficit hyperactivity disorder)    Autism    Mild    Past Surgical History:  Procedure Laterality Date   DENTAL SURGERY     3 teeth pulled at 20 yo   INNER EAR SURGERY     tubes    History reviewed. No pertinent family history. Patient was brought in by aunt who has has custody of patient since a child. Biological mother was adopted and currently in group home. Patient unaware of this and believes biological mom is his aunt. No known family hx.  Social History   Socioeconomic History   Marital status: Single    Spouse name: Not on file   Number of children: Not on file   Years of education: Not on file   Highest education level: High school graduate  Occupational History    Employer: [employer]    Comment: part-time, cleans tables  Tobacco Use   Smoking status: Never   Smokeless tobacco: Never  Substance and Sexual Activity   Alcohol use: Never   Drug use: Never   Sexual activity: Never  Other Topics Concern   Not on file  Social History Narrative   Not on file   Social Determinants of Health   Financial Resource Strain: Not on file  Food Insecurity: Not on file  Transportation Needs: Not on file  Physical Activity: Inactive   Days of Exercise per Week: 0  days   Minutes of Exercise per Session: 0 min  Stress: Not on file  Social Connections: Not on file  Intimate Partner Violence: Not on file    ROS Review of Systems  Respiratory:  Negative for shortness of breath.   Cardiovascular:  Negative for chest pain and leg swelling.  Gastrointestinal:  Negative for abdominal pain, constipation and diarrhea.  Genitourinary:  Negative for dysuria.  Musculoskeletal:  Negative for arthralgias.  Skin:  Negative for rash.   Objective:   Today's Vitals: BP 110/70   Pulse (!) 113   Ht 6\' 1"  (1.854 m)   Wt 150 lb (68 kg)   SpO2 98%   BMI 19.79 kg/m   Physical Exam Constitutional:      General: He is not in acute distress.    Appearance: He is not ill-appearing.  HENT:     Head: Normocephalic and atraumatic.  Eyes:     Conjunctiva/sclera: Conjunctivae normal.  Cardiovascular:     Rate and Rhythm: Normal rate and regular rhythm.     Pulses: Normal pulses.     Heart sounds: Normal heart sounds. No murmur heard.   No friction rub. No gallop.  Pulmonary:     Effort: Pulmonary effort is normal. No respiratory distress.     Breath sounds: Normal breath sounds. No stridor. No wheezing  or rales.  Abdominal:     General: Abdomen is flat.     Palpations: Abdomen is soft.     Tenderness: There is no abdominal tenderness. There is no guarding.  Musculoskeletal:        General: No swelling. Normal range of motion.     Cervical back: Normal range of motion.  Neurological:     General: No focal deficit present.     Gait: Gait normal.  Psychiatric:        Mood and Affect: Mood normal.    Assessment & Plan:   Problem List Items Addressed This Visit       Unprioritized   Elevated BP without diagnosis of hypertension    BP appropriate today 110/70.      Sleep disturbance    Takes hydroxyzine nightly, Adderall and guanfacine in AM.       Outpatient Encounter Medications as of 09/20/2021  Medication Sig    amphetamine-dextroamphetamine (ADDERALL XR) 20 MG 24 hr capsule Take 1 capsule (20 mg total) by mouth daily with breakfast.   GuanFACINE HCl (INTUNIV) 3 MG TB24 Take 1 tablet (3 mg total) by mouth at bedtime.   hydrOXYzine (ATARAX/VISTARIL) 25 MG tablet Take 1 tablet (25 mg total) by mouth at bedtime.   amphetamine-dextroamphetamine (ADDERALL XR) 20 MG 24 hr capsule Take 1 capsule (20 mg total) by mouth daily with breakfast.   amphetamine-dextroamphetamine (ADDERALL XR) 20 MG 24 hr capsule Take 1 capsule (20 mg total) by mouth daily with breakfast.   No facility-administered encounter medications on file as of 09/20/2021.   Flu shot  Follow-up: No follow-ups on file.   [name], MD

## 2021-09-20 NOTE — Patient Instructions (Signed)
It was great to see you! Thank you for allowing me to participate in your care!   I recommend that you always bring your medications to each appointment as this makes it easy to ensure we are on the correct medications and helps [place] not miss when refills are needed.  Our plans for today:  - We will get you the flu shot today - We will take over medication refills after your next appointment at the [facility]!  Take care and seek immediate care sooner if you develop any concerns. Please remember to show up 15 minutes before your scheduled appointment time!  [name], MD [facility]

## 2021-10-26 ENCOUNTER — Ambulatory Visit (INDEPENDENT_AMBULATORY_CARE_PROVIDER_SITE_OTHER): Payer: Medicaid Other | Admitting: Pediatrics

## 2021-10-26 ENCOUNTER — Other Ambulatory Visit: Payer: Self-pay

## 2021-10-26 ENCOUNTER — Encounter: Payer: Self-pay | Admitting: Pediatrics

## 2021-10-26 VITALS — BP 119/80 | HR 123 | Ht 71.06 in | Wt 146.8 lb

## 2021-10-26 DIAGNOSIS — G479 Sleep disorder, unspecified: Secondary | ICD-10-CM

## 2021-10-26 DIAGNOSIS — F70 Mild intellectual disabilities: Secondary | ICD-10-CM | POA: Diagnosis not present

## 2021-10-26 DIAGNOSIS — F902 Attention-deficit hyperactivity disorder, combined type: Secondary | ICD-10-CM | POA: Diagnosis not present

## 2021-10-26 MED ORDER — GUANFACINE HCL ER 3 MG PO TB24: 3.0000 mg | ORAL_TABLET | Freq: Every day | ORAL | 1 refills | Status: DC

## 2021-10-26 MED ORDER — AMPHETAMINE-DEXTROAMPHET ER 20 MG PO CP24: 20.0000 mg | ORAL_CAPSULE | Freq: Every day | ORAL | 0 refills | Status: DC

## 2021-10-26 MED ORDER — HYDROXYZINE HCL 25 MG PO TABS: 25.0000 mg | ORAL_TABLET | Freq: Every day | ORAL | 1 refills | Status: DC

## 2021-10-26 NOTE — Patient Instructions (Signed)
Make follow up appointment for medications with Dr. Laroy Apple on or just before March 1st!

## 2021-10-26 NOTE — Progress Notes (Signed)
History was provided by the patient and mother.  Derrick Swanson is a 20 y.o. male who is here for mild ID, ADHD, sleep disturbance.  [name], MD   HPI:  Pt reports he has been doing well. He has been sleeping well with hydroxyzine every night. He continues to take his ADHD medications every day. He is working at [employer] twice a week.   Mom feels like his appetite has been good and he agrees.   Planning to start seeing his new adult PCP for refills in the future.   No LMP for male patient.   Patient Active Problem List   Diagnosis Date Noted   Counseling for transition from pediatric to adult care provider 09/20/2021   Sleep disturbance 03/01/2021   Elevated BP without diagnosis of hypertension 11/30/2020   Dysfluency 09/12/2013   ADHD (attention deficit hyperactivity disorder), combined type 06/07/2013   Mild intellectual disability 06/07/2013   Receptive-expressive language delay 06/07/2013    Current Outpatient Medications on File Prior to Visit  Medication Sig Dispense Refill   amphetamine-dextroamphetamine (ADDERALL XR) 20 MG 24 hr capsule Take 1 capsule (20 mg total) by mouth daily with breakfast. 30 capsule 0   amphetamine-dextroamphetamine (ADDERALL XR) 20 MG 24 hr capsule Take 1 capsule (20 mg total) by mouth daily with breakfast. 30 capsule 0   amphetamine-dextroamphetamine (ADDERALL XR) 20 MG 24 hr capsule Take 1 capsule (20 mg total) by mouth daily with breakfast. 30 capsule 0   GuanFACINE HCl (INTUNIV) 3 MG TB24 Take 1 tablet (3 mg total) by mouth at bedtime. 90 tablet 1   hydrOXYzine (ATARAX/VISTARIL) 25 MG tablet Take 1 tablet (25 mg total) by mouth at bedtime. 90 tablet 1   No current facility-administered medications on file prior to visit.    Allergies  Allergen Reactions   Amoxicillin Hives    Physical Exam:    Vitals:   10/26/21 1329  BP: 119/80  Pulse: (!) 123  Weight: 146 lb 12.8 oz (66.6 kg)  Height: 5' 11.06" (1.805 m)    Growth  percentile SmartLinks can only be used for patients less than 41 years old.  Physical Exam Constitutional:      General: He is not in acute distress.    Appearance: He is well-developed.  Neck:     Thyroid: No thyromegaly.  Cardiovascular:     Rate and Rhythm: Normal rate and regular rhythm.     Heart sounds: No murmur heard. Pulmonary:     Breath sounds: Normal breath sounds.  Abdominal:     Palpations: Abdomen is soft. There is no mass.     Tenderness: There is no abdominal tenderness. There is no guarding.  Lymphadenopathy:     Cervical: No cervical adenopathy.  Skin:    General: Skin is warm.     Capillary Refill: Capillary refill takes less than 2 seconds.     Findings: No rash.  Neurological:     Mental Status: He is alert.     Comments: No tremor  Psychiatric:        Mood and Affect: Mood and affect normal.    Assessment/Plan: 1. ADHD (attention deficit hyperactivity disorder), combined type Doing well on current dose of adderall and guanfacine. Will continue.  - amphetamine-dextroamphetamine (ADDERALL XR) 20 MG 24 hr capsule; Take 1 capsule (20 mg total) by mouth daily with breakfast.  Dispense: 30 capsule; Refill: 0 - amphetamine-dextroamphetamine (ADDERALL XR) 20 MG 24 hr capsule; Take 1 capsule (20 mg total) by  mouth daily with breakfast.  Dispense: 30 capsule; Refill: 0 - amphetamine-dextroamphetamine (ADDERALL XR) 20 MG 24 hr capsule; Take 1 capsule (20 mg total) by mouth daily with breakfast.  Dispense: 30 capsule; Refill: 0 - GuanFACINE HCl (INTUNIV) 3 MG TB24; Take 1 tablet (3 mg total) by mouth at bedtime.  Dispense: 90 tablet; Refill: 1  2. Mild intellectual disability Overall doing well and working part time.   3. Sleep disturbance Continue hydroxyzine QHS.  - hydrOXYzine (ATARAX) 25 MG tablet; Take 1 tablet (25 mg total) by mouth at bedtime.  Dispense: 90 tablet; Refill: 1  Will transition to adult care. Discussed with mom and Garron if he has any issues  or needs additional med adjustments that PCP isn't comfortable with we can see him until 25!   Alfonso Ramus, FNP

## 2022-01-07 ENCOUNTER — Telehealth: Payer: Self-pay | Admitting: Pediatrics

## 2022-01-07 NOTE — Telephone Encounter (Signed)
Patient is requesting refill for amphetamine-dextroamphetamine (ADDERALL XR) 20 MG 24 hr capsule. Call back number is [phone]

## 2022-01-10 ENCOUNTER — Other Ambulatory Visit: Payer: Self-pay | Admitting: Pediatrics

## 2022-01-10 DIAGNOSIS — F902 Attention-deficit hyperactivity disorder, combined type: Secondary | ICD-10-CM

## 2022-01-10 MED ORDER — AMPHETAMINE-DEXTROAMPHET ER 20 MG PO CP24: 20.0000 mg | ORAL_CAPSULE | Freq: Every day | ORAL | 0 refills | Status: DC

## 2022-01-10 NOTE — Telephone Encounter (Signed)
Sent! Looks like he has next f/u with family med this month.

## 2022-01-24 NOTE — Progress Notes (Signed)
° ° °  SUBJECTIVE:   CHIEF COMPLAINT / HPI:   ADHD  Has been well controlled on long term medications of 20 mg adderall in the morning during the weekdays and guanfacine 3 mg. Appetite has been doing well, Sleep has been fine as well per patient.   Sinus Tachycardia Pulses in the 120s. Patient denies any chest pain, shortness of breath or palpitations. EKG in past showed sinus tachycardia.    PERTINENT  PMH / PSH: ADHD, intellectual disability, language delay  OBJECTIVE:   BP 108/74    Pulse (!) 126    Ht 5\' 11"  (1.803 m)    Wt 148 lb 6 oz (67.3 kg)    SpO2 97%    BMI 20.69 kg/m   General: Well appearing, NAD, awake, alert, responsive to questions Head: Normocephalic atraumatic CV: Regular rhythm, tachycardia no murmurs rubs or gallops Respiratory: Clear to ausculation bilaterally, no increased work of breathing  ASSESSMENT/PLAN:   ADHD (attention deficit hyperactivity disorder), combined type -Refilled adderall 20 mg  Sinus tachycardia Pulse in the 120s, chronic. Regular rhythm and asymptomatic -Monitor  Healthcare maintenance - Hepatitis C antibody (reflex, frozen specimen)    , MD [facility]

## 2022-01-24 NOTE — Patient Instructions (Signed)
It was great to see you! Thank you for allowing me to participate in your care!   I recommend that you always bring your medications to each appointment as this makes it easy to ensure we are on the correct medications and helps Derrick Swanson not miss when refills are needed.  Our plans for today:  - I have refilled your adderall - We will check a Hep C screening - Your heart rate is fast but regular, If you have any shortness of breath, palpitations or chest pain please return to care  We are checking some labs today, I will call you if they are abnormal will send you a MyChart message or a letter if they are normal.  If you do not hear about your labs in the next 2 weeks please let Derrick Swanson know.  Take care and seek immediate care sooner if you develop any concerns. Please remember to show up 15 minutes before your scheduled appointment time!  Derrick Erp, MD [facility]

## 2022-01-25 ENCOUNTER — Ambulatory Visit (INDEPENDENT_AMBULATORY_CARE_PROVIDER_SITE_OTHER): Payer: Medicaid Other | Admitting: Student

## 2022-01-25 ENCOUNTER — Other Ambulatory Visit: Payer: Self-pay

## 2022-01-25 VITALS — BP 108/74 | HR 126 | Ht 71.0 in | Wt 148.4 lb

## 2022-01-25 DIAGNOSIS — F902 Attention-deficit hyperactivity disorder, combined type: Secondary | ICD-10-CM

## 2022-01-25 DIAGNOSIS — R000 Tachycardia, unspecified: Secondary | ICD-10-CM | POA: Diagnosis not present

## 2022-01-25 DIAGNOSIS — Z0000 Encounter for general adult medical examination without abnormal findings: Secondary | ICD-10-CM

## 2022-01-25 MED ORDER — AMPHETAMINE-DEXTROAMPHET ER 20 MG PO CP24: 20.0000 mg | ORAL_CAPSULE | Freq: Every day | ORAL | 0 refills | Status: DC

## 2022-01-25 NOTE — Assessment & Plan Note (Signed)
Pulse in the 120s, chronic. Regular rhythm and asymptomatic -Monitor

## 2022-01-25 NOTE — Assessment & Plan Note (Signed)
-  Refilled adderall 20 mg

## 2022-01-26 LAB — HCV AB W REFLEX TO QUANT PCR: HCV Ab: NONREACTIVE

## 2022-01-26 LAB — HCV INTERPRETATION

## 2022-01-27 ENCOUNTER — Encounter: Payer: Self-pay | Admitting: Student

## 2022-03-06 ENCOUNTER — Encounter (HOSPITAL_COMMUNITY): Payer: Self-pay | Admitting: Emergency Medicine

## 2022-03-06 ENCOUNTER — Ambulatory Visit (HOSPITAL_COMMUNITY)
Admission: EM | Admit: 2022-03-06 | Discharge: 2022-03-06 | Disposition: A | Discharge status: Home / Self Care | Payer: Medicaid Other | Attending: Family Medicine | Admitting: Family Medicine

## 2022-03-06 DIAGNOSIS — R519 Headache, unspecified: Secondary | ICD-10-CM | POA: Diagnosis not present

## 2022-03-06 MED ORDER — KETOROLAC TROMETHAMINE 30 MG/ML IJ SOLN
INTRAMUSCULAR | Status: AC
  Filled 2022-03-06: qty 1

## 2022-03-06 MED ORDER — KETOROLAC TROMETHAMINE 30 MG/ML IJ SOLN
30.0000 mg | Freq: Once | INTRAMUSCULAR | Status: AC
  Administered 2022-03-06: 30 mg via INTRAMUSCULAR

## 2022-03-06 NOTE — ED Triage Notes (Signed)
Pt is present today with a HA. Pt states that his HA started yesterday. Pt took two aleve this morning. Pt states that he hit his at work on a over head lamp Friday and has been experiencing HA since then ?

## 2022-03-06 NOTE — Discharge Instructions (Addendum)
You have been given a shot of Toradol 30 mg ? ?Take ibuprofen 800 mg--1 tab every 8 hours as needed for pain.  ? ?You can ice the sore area today and tomorrow also ?

## 2022-03-06 NOTE — ED Provider Notes (Signed)
?[facility] ? ? ? ?CSN: [id number] ?Arrival date & time: 03/06/22  1227 ? ? ?  ? ?History   ?Chief Complaint ?Chief Complaint  ?Patient presents with  ? Headache  ? ? ?HPI ?Derrick Swanson is a 21 y.o. male.  ? ? ?Headache ? ?Here for h/a in his right vertex since yesterday. On 4/7 he had hit his head on an overhead lamp, in the spot where it is hurting now. No LOC. ? ?Mom gave him some naproxen (440 mg), and an hour later, states the pain is the same. ? ?He has taken his intuniv, but not his adderall today, on the weekend. HR today without Adderall is 82. ? ? ? ?Past Medical History:  ?Diagnosis Date  ? ADHD (attention deficit hyperactivity disorder)   ? Autism   ? Mild  ? ? ?Patient Active Problem List  ? Diagnosis Date Noted  ? Sinus tachycardia 01/25/2022  ? Sleep disturbance 03/01/2021  ? Elevated BP without diagnosis of hypertension 11/30/2020  ? Dysfluency 09/12/2013  ? ADHD (attention deficit hyperactivity disorder), combined type 06/07/2013  ? Mild intellectual disability 06/07/2013  ? Receptive-expressive language delay 06/07/2013  ? ? ?Past Surgical History:  ?Procedure Laterality Date  ? DENTAL SURGERY    ? 3 teeth pulled at 21 yo  ? INNER EAR SURGERY    ? tubes  ? ? ? ? ? ?Home Medications   ? ?Prior to Admission medications   ?Medication Sig Start Date End Date Taking? Authorizing Provider  ?amphetamine-dextroamphetamine (ADDERALL XR) 20 MG 24 hr capsule Take 1 capsule (20 mg total) by mouth daily with breakfast. 01/25/22   [name], MD  ?GuanFACINE HCl (INTUNIV) 3 MG TB24 Take 1 tablet (3 mg total) by mouth at bedtime. 10/26/21   Verneda Skill, FNP  ?hydrOXYzine (ATARAX) 25 MG tablet Take 1 tablet (25 mg total) by mouth at bedtime. 10/26/21   Verneda Skill, FNP  ? ? ?Family History ?History reviewed. No pertinent family history. ? ?Social History ?Social History  ? ?Tobacco Use  ? Smoking status: Never  ? Smokeless tobacco: Never  ?Substance Use Topics  ? Alcohol use: Never  ?  Drug use: Never  ? ? ? ?Allergies   ?Amoxicillin ? ? ?Review of Systems ?Review of Systems  ?Neurological:  Positive for headaches.  ? ? ?Physical Exam ?Triage Vital Signs ?ED Triage Vitals  ?Enc Vitals Group  ?   BP 03/06/22 1302 122/76  ?   Pulse Rate 03/06/22 1302 82  ?   Resp 03/06/22 1302 17  ?   Temp 03/06/22 1302 98 ?F (36.7 ?C)  ?   Temp Source 03/06/22 1302 Oral  ?   SpO2 03/06/22 1302 99 %  ?   Weight --   ?   Height --   ?   Head Circumference --   ?   Peak Flow --   ?   Pain Score 03/06/22 1305 10  ?   Pain Loc --   ?   Pain Edu? --   ?   Excl. in GC? --   ? ?No data found. ? ?Updated Vital Signs ?BP 122/76   Pulse 82   Temp 98 ?F (36.7 ?C) (Oral)   Resp 17   SpO2 99%  ? ?Visual Acuity ?Right Eye Distance:   ?Left Eye Distance:   ?Bilateral Distance:   ? ?Right Eye Near:   ?Left Eye Near:    ?Bilateral Near:    ? ?  Physical Exam ?Vitals reviewed.  ?Constitutional:   ?   General: He is not in acute distress. ?   Appearance: He is not ill-appearing, toxic-appearing or diaphoretic.  ?HENT:  ?   Head:  ?   Comments: Mild erythema of right vertex, no induration or mass ?   Right Ear: Tympanic membrane normal.  ?   Left Ear: Tympanic membrane normal.  ?   Nose: Nose normal.  ?   Mouth/Throat:  ?   Mouth: Mucous membranes are moist.  ?   Pharynx: No oropharyngeal exudate or posterior oropharyngeal erythema.  ?Eyes:  ?   Extraocular Movements: Extraocular movements intact.  ?   Conjunctiva/sclera: Conjunctivae normal.  ?   Pupils: Pupils are equal, round, and reactive to light.  ?Cardiovascular:  ?   Rate and Rhythm: Normal rate and regular rhythm.  ?   Heart sounds: No murmur heard. ?Pulmonary:  ?   Effort: Pulmonary effort is normal.  ?   Breath sounds: Normal breath sounds.  ?Musculoskeletal:  ?   Cervical back: Neck supple.  ?Lymphadenopathy:  ?   Cervical: No cervical adenopathy.  ?Skin: ?   Capillary Refill: Capillary refill takes less than 2 seconds.  ?   Coloration: Skin is not jaundiced or pale.   ?Neurological:  ?   General: No focal deficit present.  ?   Mental Status: He is alert and oriented to person, place, and time.  ?   Cranial Nerves: No cranial nerve deficit.  ?   Motor: No weakness.  ?   Deep Tendon Reflexes: Reflexes normal.  ?Psychiatric:     ?   Behavior: Behavior normal.  ? ? ? ?UC Treatments / Results  ?Labs ?(all labs ordered are listed, but only abnormal results are displayed) ?Labs Reviewed - No data to display ? ?EKG ? ? ?Radiology ?No results found. ? ?Procedures ?Procedures (including critical care time) ? ?Medications Ordered in UC ?Medications  ?ketorolac (TORADOL) 30 MG/ML injection 30 mg (has no administration in time range)  ? ? ?Initial Impression / Assessment and Plan / UC Course  ?I have reviewed the triage vital signs and the nursing notes. ? ?Pertinent labs & imaging results that were available during my care of the patient were reviewed by me and considered in my medical decision making (see chart for details). ? ?  ? ?Suspect bruised bone on his skull.  We will treat with Toradol and prescription strength ibuprofen. ?Final Clinical Impressions(s) / UC Diagnoses  ? ?Final diagnoses:  ?Acute intractable headache, unspecified headache type  ? ? ? ?Discharge Instructions   ? ?  ?You have been given a shot of Toradol 30 mg ? ?Take ibuprofen 800 mg--1 tab every 8 hours as needed for pain.  ? ?You can ice the sore area today and tomorrow also ? ? ? ? ?ED Prescriptions   ?None ?  ? ?PDMP not reviewed this encounter. ?  ?Zenia Resides, MD ?03/06/22 1325 ? ?

## 2022-03-14 ENCOUNTER — Other Ambulatory Visit: Payer: Self-pay | Admitting: Pediatrics

## 2022-03-14 DIAGNOSIS — F902 Attention-deficit hyperactivity disorder, combined type: Secondary | ICD-10-CM

## 2022-03-14 DIAGNOSIS — G479 Sleep disorder, unspecified: Secondary | ICD-10-CM

## 2022-04-04 ENCOUNTER — Other Ambulatory Visit: Payer: Self-pay

## 2022-04-04 DIAGNOSIS — F902 Attention-deficit hyperactivity disorder, combined type: Secondary | ICD-10-CM

## 2022-04-04 MED ORDER — AMPHETAMINE-DEXTROAMPHET ER 20 MG PO CP24: 20.0000 mg | ORAL_CAPSULE | Freq: Every day | ORAL | 0 refills | Status: DC

## 2022-04-11 ENCOUNTER — Telehealth: Payer: Self-pay | Admitting: Student

## 2022-04-11 NOTE — Telephone Encounter (Signed)
Patient's mother came in stating that patient needs a refill on his adderall. She called about a refill request on 5/8, but when she checked with the pharmacy last Thursday they didn't have it. If someone could please let her know if/when it is sent in please. ?

## 2022-04-19 NOTE — Progress Notes (Unsigned)
    SUBJECTIVE:   CHIEF COMPLAINT / HPI:   Medication refill  PERTINENT  PMH / PSH: ***  OBJECTIVE:   There were no vitals taken for this visit.  ***  ASSESSMENT/PLAN:   No problem-specific Assessment & Plan notes found for this encounter.     [name], MD [facility]

## 2022-04-21 ENCOUNTER — Ambulatory Visit (INDEPENDENT_AMBULATORY_CARE_PROVIDER_SITE_OTHER): Payer: Medicaid Other | Admitting: Student

## 2022-04-21 ENCOUNTER — Encounter: Payer: Self-pay | Admitting: Student

## 2022-04-21 DIAGNOSIS — R000 Tachycardia, unspecified: Secondary | ICD-10-CM

## 2022-04-21 DIAGNOSIS — F902 Attention-deficit hyperactivity disorder, combined type: Secondary | ICD-10-CM

## 2022-04-21 DIAGNOSIS — G479 Sleep disorder, unspecified: Secondary | ICD-10-CM

## 2022-04-21 MED ORDER — AMPHETAMINE-DEXTROAMPHET ER 20 MG PO CP24: 20.0000 mg | ORAL_CAPSULE | Freq: Every day | ORAL | 0 refills | Status: DC

## 2022-04-21 MED ORDER — GUANFACINE HCL ER 3 MG PO TB24: 3.0000 mg | ORAL_TABLET | Freq: Every day | ORAL | 1 refills | Status: DC

## 2022-04-21 MED ORDER — HYDROXYZINE HCL 25 MG PO TABS: 25.0000 mg | ORAL_TABLET | Freq: Every day | ORAL | 1 refills | Status: DC

## 2022-04-21 NOTE — Assessment & Plan Note (Signed)
-  Refilled Adderall 20 mg daily -Refilled guanfacine 3 mg nightly -Refilled hydroxyzine nightly Discussed with patient and grandmother that we would plan on decreasing some of these medications in the future as patient has been having a lot of difficulty with maintaining his weight.  Has had a weight loss of 10 pounds in the past 2 months.  Discussed that we can also involve psychiatry at some point as well to manage ADHD more closely.

## 2022-04-21 NOTE — Patient Instructions (Signed)
It was great to see you! Thank you for allowing me to participate in your care!   I recommend that you always bring your medications to each appointment as this makes it easy to ensure we are on the correct medications and helps [place] not miss when refills are needed.  Our plans for today:  - I refilled your medications, please try to eat and drink water-if still losing weight we can think about decreasing adderall dose or referring to psychiatry to manage this a little better - You may take miralax as needed - we will plan to check labs next visist  Take care and seek immediate care sooner if you develop any concerns. Please remember to show up 15 minutes before your scheduled appointment time!  [name], MD [facility]

## 2022-04-21 NOTE — Assessment & Plan Note (Addendum)
Asymptomatic, pulse of 112 today.  Patient wants to defer labs until next visit.

## 2022-05-03 ENCOUNTER — Encounter: Payer: Self-pay | Admitting: *Deleted

## 2022-06-27 ENCOUNTER — Other Ambulatory Visit: Payer: Self-pay

## 2022-06-27 DIAGNOSIS — F902 Attention-deficit hyperactivity disorder, combined type: Secondary | ICD-10-CM

## 2022-06-27 NOTE — Telephone Encounter (Signed)
Patient's mother calls nurse line requesting refill on Adderall. Mother has scheduled patient appointment for 8/14, however, patient will run out in two days.  Please advise  Veronda Prude, RN

## 2022-06-28 MED ORDER — AMPHETAMINE-DEXTROAMPHET ER 20 MG PO CP24: 20.0000 mg | ORAL_CAPSULE | Freq: Every day | ORAL | 0 refills | Status: DC

## 2022-06-28 NOTE — Telephone Encounter (Signed)
Patient's mother returns call to nurse line regarding prescription. Advised of refill policy. Mother is concerned as patient is almost out.   Forwarding to PCP.   Veronda Prude, RN

## 2022-07-10 NOTE — Patient Instructions (Signed)
It was great to see you! Thank you for allowing me to participate in your care!   Our plans for today:  - I have refilled your adderall prescription - Please message me if interested in psychiatry referral  Take care and seek immediate care sooner if you develop any concerns.  [name], MD

## 2022-07-10 NOTE — Progress Notes (Signed)
    SUBJECTIVE:   CHIEF COMPLAINT / HPI:   ADHD  Continues to take 20 mg adderall in the morning during weekdays and guanfacine 3 mg nightly. Weight has been up. At last visit weighed 138 lbs and today is 141. Appetite is okay, eats breakfast and dinner but works through lunch.  He is unable to work at [employer] without his Adderall.  Taking hydroxyzine at night for sleep.  Is not interested in psychiatry referral at this moment.  Sinus tachycardia Pulse today is 98  PERTINENT  PMH / PSH: Intellectual disability  OBJECTIVE:   BP 120/87   Pulse 98   Ht 5\' 11"  (1.803 m)   Wt 141 lb 12.8 oz (64.3 kg)   SpO2 100%   BMI 19.78 kg/m   General: Well appearing, NAD, awake, alert, responsive to questions Head: Normocephalic atraumatic CV: Regular rate and rhythm no murmurs rubs or gallops Respiratory: Clear to ausculation bilaterally, no wheezes rales or crackles, chest rises symmetrically,  no increased work of breathing Abdomen: Soft, non-tender, non-distended, normoactive bowel sounds  Extremities: Moves upper and lower extremities freely, no edema in LE  ASSESSMENT/PLAN:   ADHD (attention deficit hyperactivity disorder), combined type Patient has been sleeping and gaining weight since last time. -Continue Adderall 20 mg daily, refilled today -Continue guanfacine 3 mg nightly -Continue hydroxyzine nightly -Continue discussions of psychiatry referral at following visits  Sinus tachycardia 98 today, improved, asymptomatic. -Monitor   , MD [facility]

## 2022-07-11 ENCOUNTER — Encounter: Payer: Self-pay | Admitting: Student

## 2022-07-11 ENCOUNTER — Ambulatory Visit (INDEPENDENT_AMBULATORY_CARE_PROVIDER_SITE_OTHER): Payer: Medicaid Other | Admitting: Student

## 2022-07-11 DIAGNOSIS — R000 Tachycardia, unspecified: Secondary | ICD-10-CM | POA: Diagnosis not present

## 2022-07-11 DIAGNOSIS — F902 Attention-deficit hyperactivity disorder, combined type: Secondary | ICD-10-CM

## 2022-07-11 MED ORDER — AMPHETAMINE-DEXTROAMPHET ER 20 MG PO CP24: 20.0000 mg | ORAL_CAPSULE | Freq: Every day | ORAL | 0 refills | Status: DC

## 2022-07-11 NOTE — Assessment & Plan Note (Signed)
Patient has been sleeping and gaining weight since last time. -Continue Adderall 20 mg daily, refilled today -Continue guanfacine 3 mg nightly -Continue hydroxyzine nightly -Continue discussions of psychiatry referral at following visits

## 2022-07-11 NOTE — Assessment & Plan Note (Signed)
98 today, improved, asymptomatic. -Monitor

## 2022-08-22 ENCOUNTER — Telehealth: Payer: Self-pay

## 2022-08-22 DIAGNOSIS — F902 Attention-deficit hyperactivity disorder, combined type: Secondary | ICD-10-CM

## 2022-08-22 MED ORDER — AMPHETAMINE-DEXTROAMPHET ER 20 MG PO CP24: 20.0000 mg | ORAL_CAPSULE | Freq: Every day | ORAL | 0 refills | Status: DC

## 2022-08-22 NOTE — Telephone Encounter (Signed)
Patient's mother calls nurse line requesting refill on Adderall.   90 day supply was sent over on 07/11/22. Called pharmacy. Medicaid only pays for 30 day supply. They canceled remaining 60 capsules from this prescription.   Patient now needs new prescription for 30 capsules.   Prescription pended to this encounter.   Talbot Grumbling, RN

## 2022-08-23 ENCOUNTER — Other Ambulatory Visit: Payer: Self-pay | Admitting: Student

## 2022-08-23 DIAGNOSIS — F902 Attention-deficit hyperactivity disorder, combined type: Secondary | ICD-10-CM

## 2022-08-23 MED ORDER — AMPHETAMINE-DEXTROAMPHET ER 20 MG PO CP24: 20.0000 mg | ORAL_CAPSULE | Freq: Every day | ORAL | 0 refills | Status: DC

## 2022-08-23 NOTE — Telephone Encounter (Signed)
Mother calls nurse line checking the status of medication refill.   Medication was sent in yesterday for #30, however was set to "print."   Please resend.

## 2022-08-23 NOTE — Addendum Note (Signed)
Addended by: Dorna Bloom on: 08/23/2022 09:19 AM   Modules accepted: Orders

## 2022-08-30 ENCOUNTER — Other Ambulatory Visit: Payer: Self-pay | Admitting: Student

## 2022-08-30 ENCOUNTER — Telehealth: Payer: Medicaid Other | Admitting: Family

## 2022-08-30 ENCOUNTER — Telehealth: Payer: Self-pay | Admitting: Student

## 2022-08-30 DIAGNOSIS — F902 Attention-deficit hyperactivity disorder, combined type: Secondary | ICD-10-CM

## 2022-08-30 NOTE — Telephone Encounter (Signed)
Routed message to PCP. Phong Isenberg, CMA  

## 2022-08-30 NOTE — Telephone Encounter (Signed)
Patient's mother called stating that patient would like to get a psychiatry referral

## 2022-08-31 NOTE — Telephone Encounter (Signed)
Referral sent to [facility] for review.  They will call parent about an appt.  [place] Address: [address], [place], [place] [address] Phone: [phone]

## 2022-09-05 ENCOUNTER — Ambulatory Visit (INDEPENDENT_AMBULATORY_CARE_PROVIDER_SITE_OTHER): Payer: Medicaid Other

## 2022-09-05 DIAGNOSIS — Z23 Encounter for immunization: Secondary | ICD-10-CM | POA: Diagnosis present

## 2022-09-05 NOTE — Progress Notes (Signed)
Patient presents to nurse clinic for flu vaccination. Administered in LD, site unremarkable, tolerated injection well.   Zailee Vallely C Micahel Omlor, RN   

## 2022-09-29 ENCOUNTER — Other Ambulatory Visit: Payer: Self-pay | Admitting: Student

## 2022-09-29 DIAGNOSIS — F902 Attention-deficit hyperactivity disorder, combined type: Secondary | ICD-10-CM

## 2022-09-29 MED ORDER — AMPHETAMINE-DEXTROAMPHET ER 20 MG PO CP24: 20.0000 mg | ORAL_CAPSULE | Freq: Every day | ORAL | 0 refills | Status: DC

## 2022-10-19 ENCOUNTER — Telehealth (INDEPENDENT_AMBULATORY_CARE_PROVIDER_SITE_OTHER): Payer: Medicaid Other | Admitting: Family

## 2022-10-19 ENCOUNTER — Encounter: Payer: Self-pay | Admitting: Family

## 2022-10-19 DIAGNOSIS — G479 Sleep disorder, unspecified: Secondary | ICD-10-CM | POA: Diagnosis not present

## 2022-10-19 DIAGNOSIS — F819 Developmental disorder of scholastic skills, unspecified: Secondary | ICD-10-CM

## 2022-10-19 DIAGNOSIS — Z79899 Other long term (current) drug therapy: Secondary | ICD-10-CM

## 2022-10-19 DIAGNOSIS — R000 Tachycardia, unspecified: Secondary | ICD-10-CM

## 2022-10-19 DIAGNOSIS — F902 Attention-deficit hyperactivity disorder, combined type: Secondary | ICD-10-CM | POA: Diagnosis not present

## 2022-10-19 DIAGNOSIS — F802 Mixed receptive-expressive language disorder: Secondary | ICD-10-CM

## 2022-10-19 DIAGNOSIS — R4789 Other speech disturbances: Secondary | ICD-10-CM

## 2022-10-19 DIAGNOSIS — F70 Mild intellectual disabilities: Secondary | ICD-10-CM | POA: Diagnosis not present

## 2022-10-19 DIAGNOSIS — Z8659 Personal history of other mental and behavioral disorders: Secondary | ICD-10-CM

## 2022-10-19 DIAGNOSIS — Z7189 Other specified counseling: Secondary | ICD-10-CM

## 2022-10-19 DIAGNOSIS — F69 Unspecified disorder of adult personality and behavior: Secondary | ICD-10-CM

## 2022-10-19 MED ORDER — GUANFACINE HCL ER 3 MG PO TB24: 3.0000 mg | ORAL_TABLET | Freq: Every day | ORAL | 1 refills | Status: DC

## 2022-10-19 MED ORDER — AMPHETAMINE-DEXTROAMPHET ER 20 MG PO CP24: 20.0000 mg | ORAL_CAPSULE | Freq: Every day | ORAL | 0 refills | Status: DC

## 2022-10-19 MED ORDER — HYDROXYZINE HCL 25 MG PO TABS: 25.0000 mg | ORAL_TABLET | Freq: Every day | ORAL | 1 refills | Status: AC

## 2022-10-19 NOTE — Progress Notes (Signed)
[facility] [address] [place] [address] Dept: [phone] Dept Fax: [phone] Loc: [phone] Loc Fax: [phone]  New Patient Initial Visit  Patient ID: Derrick Swanson, male  DOB: Sep 06, 2001, 21 y.o.  MRN: [id number]  Primary Care Provider:Jagadish, Derrick Boatman, MD  Interviewed: Derrick Swanson, Adoptive Mother Vanetta Shawl)  Virtual Visit via Video Note I connected with  Derrick Swanson  and Derrick Swanson 's Mother (Name Derrick Swanson) on 10/19/22 at  8:00 AM EST by a video enabled telemedicine application and verified that I am speaking with the correct person using two identifiers. Patient/Parent Location: at home  I discussed the limitations, risks, security and privacy concerns of performing an evaluation and management service by telephone and the availability of in person appointments. I also discussed with the parents that there may be a patient responsible charge related to this service. The parents expressed understanding and agreed to proceed.  Provider: Carron Curie, NP  Location: private work location  Presenting Concerns-Developmental/Behavioral: Mother reports Derrick Swanson was seeing Dr. Inda Swanson from the time of 21 years of age until 21 years of age. He had been transferred to Derrick Swanson Adolescent medication until he turned 21 years old and recently seen by PCP for medication management. Derrick Swanson has been delayed with speech and motor skills. He was adopted from day 1 with his biological mother in a group home with limited abilities to care for herself and a child due to her level of cognitive functioning. Derrick Swanson is currently working a few days/week for 4 hours in the morning at [employer]. He has been on medication for his ADHD since he was diagnosed at 10 year old. Mother is requesting medication management for his ongoing issues and symptoms of his ADHD along with  ODD behaviors.   Educational History: Current School Name: [employer] Sw[employer] School Grade: 12th grade graduated with a certificate Derrick Swanson with [employer] graduated with his diploma Special Services (Resource/Self-Contained Class): smaller classroom setting for elementary and middle school. Mainstreamed for high school with electives and help in the core classrooms Speech Therapy: Speech therapy from 1st until after graduation OT/PT: started in 1st grade until 5th grade Other (Tutoring, Counseling, EI, IFSP, IEP, 504 Plan) : IEP starting in 1st grade until graduation.   Participates in [facility] Swanson of [facility]. Program for outings 1 time weekly and activities to participate in but only attended 1 time.   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 IQ Testing (Date/Type): 12th grade has testing completed before graduation Counseling/Therapy: Previously had attended therapy, but not currently.  Perinatal History: Prenatal History:  Mother was in a group home at the time of the pregnancy History of Behaviors issues Maternal Age: 21 years old Gravida: 1 Para: 0 LC: 0 AB: 0  Stillbirth: 0 Maternal Health Before Pregnancy? Healthy but medication for Bi-polar and sleep medication.  Approximate month began prenatal care: 5 months pregnant  Maternal Risks/Complications: medications for mental health  Smoking: no Alcohol: no Substance Abuse/Drugs: No Fetal Activity: good  Teratogenic Exposures: medications  Neonatal History: Sw[facility] Name/city: [facility] Labor Duration: not much time  Induced/Spontaneous: No - Spontaneous  Meconium at Birth? No  Labor Complications/ Concerns: None reported Anesthetic: none EDC: full term  Gestational Age Derrick Swanson): full term Delivery: Vaginal, no problems at delivery Apgar Scores:  unrecalled NICU/Normal  Nursery: NBN Condition at Birth: within normal limits  Weight: 6-8 lbs Length: unrecalled  OFC (Head Circumference): unrecalled  Neonatal Problems: Feeding Bottle with no issues with constipation.  Developmental History: General: Infancy: Cried a lot and ate every 2 hours, changed formula Were there any developmental concerns? delayed Childhood: Delayed milestones Gross Motor: 15 months started walking, did crawl and sat up but unrecalled for the age.  Fine Motor: Delayed and learning to tie his shoes in middle school. Has issues with fine motor with buttoning and fine motor issues.  Speech/ Language: Delayed speech-language therapy, started talking at 21 years of age. Has continued with speech issues. Spoke clearly for others to understand at 21 years of age. Has articulation issues.  Self-Help Skills (toileting, dressing, etc.): encopresis, started early on and still has constipation issues. Difficulty to potty trained and urination in the potty at 21 years old and wore pull ups until 21 years of age with nocturnal urination. Started Miralax daily at 21 years of age for constipation and holding issues.  Social/ Emotional (ability to have joint attention, tantrums, etc.):  interacts well with co-workers and has a Child psychotherapist at work, [employer], [employer]  Sleep: has difficulty falling asleep, busy at night and trouble with settling down. Takes Hydroxyzine now at 8-9:00 pm and still will wake up in the night  Sensory Integration Issues: loud noises, chew his shirts in elementary school, chewed his sheets still now,  General Health: Healthy   General Medical History: Immunizations up to date? Yes  Accidents/Traumas: Laceration to chin from jumping on the bed, walks on his tip toes, runs and  Hospitalizations/ Operations: Tubes in his ears at 21 years of age.  Asthma/Pneumonia: None Ear Infections/Tubes: Tubes for hearing to assist with speech  Neurosensory Evaluation (Parent Concerns, Dates of  Tests/Screenings, Physicians, Surgeries):  Hearing screening: Passed screen within last year per parent report Vision screening: Passed screen within last year per parent report Seen by Ophthalmologist? Yes, Date: yearly and has amblyopia Nutrition Status: Picky eater and will eat certain foods. Limited intake with medication.Eats enough daily with current intake.   Current Medications:  Current Outpatient Medications  Medication Sig Dispense Refill  . [START ON 10/28/2022] amphetamine-dextroamphetamine (ADDERALL XR) 20 MG 24 hr capsule Take 1 capsule (20 mg total) by mouth daily with breakfast. 30 capsule 0  . GuanFACINE HCl (INTUNIV) 3 MG TB24 Take 1 tablet (3 mg total) by mouth at bedtime. 90 tablet 1  . hydrOXYzine (ATARAX) 25 MG tablet Take 1 tablet (25 mg total) by mouth at bedtime. 90 tablet 1   No current facility-administered medications for this visit.   Past Meds Tried: history of tried medications in the past but not sure what the medications were in the past.  Allergies: Food?  No, Fiber? No, Medications?  Yes Amoxicillin, and Environment?  No  Review of Systems: Review of Systems  Psychiatric/Behavioral:  Positive for decreased concentration and sleep disturbance. The patient is hyperactive.   All other systems reviewed and are negative.  Age of Menarche: n/a Sex/Sexuality: male  Special Medical Tests: EKG-02/2021 Newborn Screen: Pass Toddler Lead Levels: Pass Pain: No  Family History:(Select all that apply within two generations of the patient) unknown  Maternal History: (Biological Mother if known/ Adopted Mother if not known) Mother's name: Jarmarcus Wambold (adopted)   Age: 21 years old General Health/Medications: Bi-polar disorder, HTN, Amblyopia, IDD Highest Educational Level: 12 + and certificate Learning Problems: small classroom setting and had an  IEP. 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  Biological family history is limited. History of behavior  issues and speech delays. Biological Mother's Siblings: Hydrographic surveyor, Age, Medical history, Psych history, LD history) adopted and unknown history.   Paternal History: (Biological Father if known/ Adopted Father if not known) Unknown paternal history but does know that he had ADHD.  Patient Siblings: BOTH SIBLINGS ARE ADOPTED Name: Ivin Booty  Gender: male  Biological?: Yes by mother Age: 66 years old Adopted?: No.  Health Concerns: unknown Educational Level: smaller classroom and graduated  Learning Problems: behavior and developmental issues  Name: Laurena Spies  Gender: male  Biological?: Yes by mother. Adopted?: No. Age: 22 years old Health Concerns: History of behavior issues and mental health issues.  Educational Level: 10th grade  Learning Problems: behavior and ADHD.   Expanded Medical history, Extended Family, Social History (types of dwelling, water source, pets, patient currently lives with, etc.): Lives with adoptive mother and dog.   Mental Health Intake/Functional Status: General Behavioral Concerns: ODD behaviors and hyperactive. Does child have any concerning habits (pica, thumb sucking, pacifier)? Yes still eating paper and tearing it up leaving around the house. Specific Behavior Concerns and Mental Status: significant delay  Does child have any tantrums? (Trigger, description, lasting time, intervention, intensity, remains upset for how long, how many times a day/week, occur in which social settings): yes, anger and outbursts.  Does child have any toilet training issue? (enuresis, encopresis, constipation, stool holding) : constipation  Does child have any functional impairments in adaptive behaviors? : yes, needing help with 1:1 for work with a Psychologist, occupational.   DIAGNOSES:    ICD-10-CM   1. History of ADHD  Z86.59     2. ADHD (attention deficit hyperactivity disorder), combined type  F90.2 GuanFACINE HCl (INTUNIV) 3 MG TB24    amphetamine-dextroamphetamine (ADDERALL XR) 20 MG 24  hr capsule    3. Sleep disturbance  G47.9 hydrOXYzine (ATARAX) 25 MG tablet    4. Mild intellectual disability  F70     5. Receptive-expressive language delay  F80.2     6. Dysfluency  R47.89     7. Sinus tachycardia  R00.0     8. Behavior concern in adult  F69     9. Learning disability  F81.9      ASSESSMENT:      Peace is a 22 year old male with a history of ADHD, L/D, IDD, speech and motor delays.   PLAN/RECOMMENDATIONS:  Discussed current difficulties and history of medication management with Dr. Inda Swanson.   Medical history, Developmental history, prenatal and Swanson natal history, academic history, family history, social interactions, peer interactions, and home behaviors.   Medication history with treatment and current medication   Counseled medication pharmacokinetics, options, dosage, administration, desired effects, and possible side effects.   Adderall XR 20 mg daily, #30 with no RF's Swanson dated for 10/28/2022 Intuniv 3 mg daily, #90 with 1 RF's Hydroxyzine  25 mg daily, #90 with 1 RF's RX for above e-scribed and sent to pharmacy on record  [id number] [place], [place] - [address] AT [facility] [address]  [place] [address] Phone: [phone] Fax: [phone]  I discussed the assessment and treatment plan with parent. Parent was provided an opportunity to ask questions and all were answered. Parent agreed with the plan and demonstrated an understanding of the instructions.  REVIEW OF CHART, FACE TO FACE CLINIC TIME AND DOCUMENTATION TIME DURING TODAY'S VISIT:  ***  NEXT APPOINTMENT:  12/15/2022-ND evaluation  The parent was advised to call back or seek an in-person evaluation if the symptoms worsen or if the condition fails to improve as anticipated.  Derrick Curieawn M Paretta-Leahey, NP

## 2022-10-20 ENCOUNTER — Encounter: Payer: Self-pay | Admitting: Family

## 2022-11-30 ENCOUNTER — Other Ambulatory Visit: Payer: Self-pay | Admitting: Family

## 2022-11-30 DIAGNOSIS — F902 Attention-deficit hyperactivity disorder, combined type: Secondary | ICD-10-CM

## 2022-12-01 MED ORDER — AMPHETAMINE-DEXTROAMPHET ER 20 MG PO CP24: 20.0000 mg | ORAL_CAPSULE | Freq: Every day | ORAL | 0 refills | Status: DC

## 2022-12-01 NOTE — Telephone Encounter (Signed)
Adderall XR 20 mg daily, #30 with no RF's.RX for above e-scribed and sent to pharmacy on record  [id number] - [place], [place] [address]  [place] [address] Phone: [phone] Fax: [phone]

## 2022-12-15 ENCOUNTER — Encounter: Payer: Self-pay | Admitting: Family

## 2022-12-15 ENCOUNTER — Ambulatory Visit (INDEPENDENT_AMBULATORY_CARE_PROVIDER_SITE_OTHER): Payer: Medicaid Other | Admitting: Family

## 2022-12-15 VITALS — BP 112/64 | HR 76 | Resp 16 | Ht 71.75 in | Wt 138.6 lb

## 2022-12-15 DIAGNOSIS — R4789 Other speech disturbances: Secondary | ICD-10-CM | POA: Diagnosis not present

## 2022-12-15 DIAGNOSIS — Z79899 Other long term (current) drug therapy: Secondary | ICD-10-CM

## 2022-12-15 DIAGNOSIS — G479 Sleep disorder, unspecified: Secondary | ICD-10-CM

## 2022-12-15 DIAGNOSIS — F902 Attention-deficit hyperactivity disorder, combined type: Secondary | ICD-10-CM

## 2022-12-15 DIAGNOSIS — R278 Other lack of coordination: Secondary | ICD-10-CM

## 2022-12-15 DIAGNOSIS — F802 Mixed receptive-expressive language disorder: Secondary | ICD-10-CM

## 2022-12-15 DIAGNOSIS — F70 Mild intellectual disabilities: Secondary | ICD-10-CM

## 2022-12-15 DIAGNOSIS — Z719 Counseling, unspecified: Secondary | ICD-10-CM

## 2022-12-15 DIAGNOSIS — R000 Tachycardia, unspecified: Secondary | ICD-10-CM

## 2022-12-15 DIAGNOSIS — F819 Developmental disorder of scholastic skills, unspecified: Secondary | ICD-10-CM

## 2022-12-15 DIAGNOSIS — Z7189 Other specified counseling: Secondary | ICD-10-CM

## 2022-12-15 MED ORDER — AMPHETAMINE-DEXTROAMPHET ER 20 MG PO CP24: 20.0000 mg | ORAL_CAPSULE | Freq: Every day | ORAL | 0 refills | Status: DC

## 2022-12-15 NOTE — Progress Notes (Signed)
[facility] [address] [place] [address] Dept: [phone] Dept Fax: [phone] Loc: [phone] Loc Fax: [phone]  Neurodevelopmental Evaluation  Patient ID: Derrick Swanson, male  DOB: Mar 27, 2001, 22 y.o.  MRN: [id number]  DATE: 12/15/22  This is the first pediatric Neurodevelopmental Evaluation.  Patient is [name] and cooperative and present with mother in the exam room for the entire visit.   The Intake interview was completed on 10/19/2022.  Please review Epic for pertinent histories and review of Intake information.   The reason for the evaluation is to address concerns for Attention Deficit Hyperactivity Disorder (ADHD) or additional learning challenges.   Neurodevelopmental Examination: Derrick Swanson is alert, active and in no acute distress. He a taller, slender build with no significant dysmorphic features noted.  Growth Parameters: Height: 71.75 inches  Weight: 138.6 lbs  OFC: 23 inches  BP: 112/64  General Exam: Physical Exam Vitals reviewed.  Constitutional:      Appearance: Normal appearance. He is well-developed.       Comments: thin  HENT:     Head: Normocephalic and atraumatic.     Right Ear: Tympanic membrane, ear canal and external ear normal.     Left Ear: Tympanic membrane, ear canal and external ear normal.     Nose: Nose normal.     Mouth/Throat:     Mouth: Mucous membranes are moist.  Eyes:     Extraocular Movements: Extraocular movements intact.     Conjunctiva/sclera: Conjunctivae normal.     Pupils: Pupils are equal, round, and reactive to light.  Neck:     Trachea: Trachea normal.  Cardiovascular:     Rate and Rhythm: Normal rate and regular rhythm.     Pulses: Normal pulses.     Heart sounds: Normal heart sounds.  Pulmonary:     Effort: Pulmonary effort is normal.     Breath sounds: Normal breath  sounds.  Abdominal:     General: Bowel sounds are normal.     Palpations: Abdomen is soft.  Musculoskeletal:        General: Normal range of motion.     Cervical back: Full passive range of motion without pain, normal range of motion and neck supple.  Skin:    General: Skin is warm and dry.     Capillary Refill: Capillary refill takes less than 2 seconds.     Comments: Lighter pigmentation on the upper torso with pigmentation slightly darker with spotty areas on the lower torso/abdomen area.   Neurological:     General: No focal deficit present.     Mental Status: He is alert. Mental status is at baseline.     Deep Tendon Reflexes: Reflexes are normal and symmetric.  Psychiatric:        Mood and Affect: Mood normal.        Behavior: Behavior normal.        Thought Content: Thought content normal.        Judgment: Judgment normal.   Neurological: Language Sample: articulation delay, abnormal for age with "S: sounds/blends Oriented: oriented to place and person Cranial Nerves: normal  Neuromuscular: Motor: muscle mass: normal  Strength: normal  Tone: normal Deep Tendon Reflexes: 2+ and symmetric Overflow/Reduplicative Beats: None Clonus: without  Babinskis: negative Primitive Reflex Profile: n/a  Cerebellar: no tremors noted, finger to nose without dysmetria bilaterally, performs thumb to finger exercise without difficulty but at times  was looking a finger placement, no palmar drift, heel to shin without dysmetria but needed to use his arms to balance, gait was abnormal - with some toe walking noted, difficulty with tandem and needed to balance with both arms straight out by his side, can toe walk, can heel walk, can hop on each foot, can stand on each foot independently for 5 seconds, and no ataxic movements noted  Sensory Exam: Fine touch: intact  Vibratory: intact  Gross Motor Skills: Walks, Runs, Up on Tip Toe, Jumps 24", Stands on 1 Foot (R), Stands on 1 Foot (L), Tandem (F),  Tandem (R), and Skips Orthotic Devices: None  Developmental Examination: Developmental/Cognitive Testing: Gesell Figures: 6-year level, Market researcher A Person: 8-year, 44-month level, Auditory Digits D/F: 2 1/2-year level=3/3, 3-year level=3/3, 4 1/2-year level=2/3, 7-year level=1/3, 10-year level=0/3, Auditory Digits D/R: unable to comprehend, Visual/Oral D/F: 5 number digit span, Visual/Oral D/R: unable to comprehend, Auditory Sentences: 4-year,100-month level, Reading: Regulatory affairs officer) Single Words: Kindergarten through 2nd grade level=20/20, 3rd grade level=18/20, 4th grade level=17/20, 5th grade level=15/20, 6th grade level=11/20, Reading: Grade Level: Late Elementary School level, Reading: Paragraphs/Decoding: 100% with 100% comprehension at the 3rd grade level with context clues, 4th grade level was 80% with 80% comprehension ad 75% comprehension with provider reading the information along with giving context clues,  Reading: Paragraphs/Decoding Grade Level: mid to late elementary school level, and Other Comments: Derrick Swanson was noted to be left-handed but with throwing or kicking a ball was right sided dominant. He used his left hand with a 4-finger fisted grip with the thumb over the first and second digit held low with increased pressure applied at the tip.  Paper was anchored with the opposite hand.  Some visual spatial issues with the [employer], but put in a good amount of effort on the Good Enough Draw a Person. More processing time was required with his written output due to his motor planning difficulties. It took Derrick Swanson an increased amount of time to complete the alphabet and 2 sentences. Alphabet was incomplete and in not in correct order. He displayed a profound amount of adversity with spelling words along with capitalization and punctuation with writing the sentences. Derrick Swanson struggled with word attack skill problems, especially with decoding of the [place] Single Word Reading List. Derrick Swanson had problems with  fluency, especially when reading out loud, and decreased comprehension. His problem with short term auditory memory was more noticeable with recalling information from the reading with required context clues. Derrick Swanson was cooperative and interactive with an exceptional amount of effort put forth during the evaluation. Mother was present and supportive during the entire visit.    Diagnoses:    ICD-10-CM   1. ADHD (attention deficit hyperactivity disorder), combined type  F90.2 amphetamine-dextroamphetamine (ADDERALL XR) 20 MG 24 hr capsule    2. Mild intellectual disability  F70     3. Receptive-expressive language delay  F80.2     4. Dysfluency  R47.89     5. Sinus tachycardia  R00.0     6. Sleep disturbance  G47.9     7. 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 Medication management  Z79.899     10. Patient counseled  Z71.9     11. Goals of care, counseling/discussion  Z71.89     Assessment: Derrick Swanson is a 22 year old male with a history of ADHD, L/D, IDD, developmental delays and sleep initiation problems. He has been maintained on  Adderall XR 20 mg daily with Intuniv 3 mg during the day with Hydroxyzine 25 mg at HS. Jassiel has been on medication for symptom control since kindergarten and no current side effects reported. Today's evaluation was consistent with developmental delays related to his age with reading and writing tasks along with working memory difficulties. Derrick Swanson took his medication prior to the visit and he was able to remain seated with no extraneous movement and complete tasks with no concerns. Continuation of current medications and dosing.  Recommendations:  Today's evaluation discussed with mother and Derrick Swanson.  History of developmental delays and consistency of findings today discussed.  Care for his medical needs discussed along with current medications reviewed.   Supported caloric intake when possible daily and not giving meds on weekends for more intake.    Working environment discussed with possible assistance needed with certain tasks.   May consider neurology f/u or genetic testing with Lineagen related to developmental delays.  Counseled medication pharmacokinetics, options, dosage, administration, desired effects, and possible side effects.   Adderall XR 20 mg daily, #30 with no RF's post dated for 01/01/2023, 01/30/2023 Intuniv 3 mg daily, no Rx today Hydroxyzine  25 mg daily, no Rx today RX for above e-scribed and sent to pharmacy on record   [id number] - [place], [place] [address] [place] [address] Phone: [phone] Fax: [phone]  I discussed the assessment and treatment plan with parent. Parent was provided an opportunity to ask questions and all were answered. Parent agreed with the plan and demonstrated an understanding of the instructions.   Recall Appointment: 3 month f/u visit   The parent was advised to call back or seek an in-person evaluation if the symptoms worsen or if the condition fails to improve as anticipated.   Examiners: Carolann Littler, NP

## 2023-01-17 ENCOUNTER — Ambulatory Visit: Payer: Medicaid Other | Admitting: Family

## 2023-07-06 ENCOUNTER — Ambulatory Visit (INDEPENDENT_AMBULATORY_CARE_PROVIDER_SITE_OTHER): Payer: MEDICAID | Admitting: Student

## 2023-07-06 ENCOUNTER — Encounter: Payer: Self-pay | Admitting: Student

## 2023-07-06 VITALS — BP 104/78 | HR 98 | Ht 71.0 in | Wt 142.0 lb

## 2023-07-06 DIAGNOSIS — F902 Attention-deficit hyperactivity disorder, combined type: Secondary | ICD-10-CM

## 2023-07-06 DIAGNOSIS — Z13228 Encounter for screening for other metabolic disorders: Secondary | ICD-10-CM

## 2023-07-06 NOTE — Progress Notes (Signed)
    SUBJECTIVE:   Chief compliant/HPI: annual examination  Derrick Swanson is a 22 y.o. who presents today for an annual exam. Overall feeling really well. Still working at [employer].  Does a lot of activity of walking at his job.  He is well-controlled on his Adderall for ADHD as well as guanfacine.  He follows with psychology who manages this.  His weight has been stable.  Denies any weight gain/weight loss, polyuria, polydipsia. Intellectual disability, not sexually active.   Updated history tabs and problem list.   OBJECTIVE:   BP 104/78   Pulse 98   Ht 5\' 11"  (1.803 m)   Wt 142 lb (64.4 kg)   SpO2 100%   BMI 19.80 kg/m   General: Well appearing, NAD, awake, alert, responsive to questions Head: Normocephalic atraumatic CV: Regular rate and rhythm no murmurs rubs or gallops Respiratory: Clear to ausculation bilaterally, no wheezes rales or crackles, chest rises symmetrically,  no increased work of breathing Abdomen: Soft, non-tender, non-distended, normoactive bowel sounds  Extremities: Moves upper and lower extremities freely, no edema in LE Neuro: No focal deficits Skin: No rashes or lesions visualized   ASSESSMENT/PLAN:  ADHD (attention deficit hyperactivity disorder), combined type Well controlled on current regimen. Follows with psychology. -CMP  Annual Examination  See AVS for age appropriate recommendations.      07/11/2022    9:24 AM 01/25/2022   11:21 AM 10/26/2021    1:55 PM  PHQ9 SCORE ONLY  PHQ-9 Total Score 0 0 0     PHQ score 0 , reviewed and discussed. Blood pressure reviewed and at goal.   Considered the following items based upon USPSTF recommendations: HIV testing: discussed GC/CT not at high risk and not ordered. Lipid panel (nonfasting or fasting) discussed based upon AHA recommendations and not ordered.  Consider repeat every 4-6 years.  Immunizations - COVID last year --will get flu and covid in fall  Follow up in 1  year or sooner if  indicated.    [name], MD [facility]

## 2023-07-06 NOTE — Patient Instructions (Signed)
It was great to see you! Thank you for allowing me to participate in your care!   I recommend that you always bring your medications to each appointment as this makes it easy to ensure we are on the correct medications and helps [place] not miss when refills are needed.  Our plans for today:  - Continue medications as you have been taking - Make sure to not over exert yourself at work! - We will check some labs today  Today at your annual preventive visit we talked about the following measures:   I recommend 150 minutes of exercise per week-try 30 minutes 5 days per week Recommend reducing sugary beverages (like soda and juice) and increasing leafy greens and whole fruits.  We discussed avoiding tobacco and alcohol.  I recommend avoiding illicit substances.  Your blood pressure is at goal .    We are checking some labs today, I will call you if they are abnormal will send you a MyChart message or a letter if they are normal.  If you do not hear about your labs in the next 2 weeks please let us know.  Take care and seek immediate care sooner if you develop any concerns. Please remember to show up 15 minutes before your scheduled appointment time!  [name], MD [facility]

## 2023-07-06 NOTE — Assessment & Plan Note (Signed)
Well controlled on current regimen. Follows with psychology. -CMP

## 2023-10-01 ENCOUNTER — Other Ambulatory Visit: Payer: Self-pay

## 2023-10-01 ENCOUNTER — Emergency Department (HOSPITAL_COMMUNITY)
Admission: EM | Admit: 2023-10-01 | Discharge: 2023-10-01 | Disposition: A | Discharge status: Home / Self Care | Payer: MEDICAID | Attending: Emergency Medicine | Admitting: Emergency Medicine

## 2023-10-01 ENCOUNTER — Encounter (HOSPITAL_COMMUNITY): Payer: Self-pay

## 2023-10-01 DIAGNOSIS — M542 Cervicalgia: Secondary | ICD-10-CM | POA: Diagnosis present

## 2023-10-01 DIAGNOSIS — F840 Autistic disorder: Secondary | ICD-10-CM | POA: Diagnosis not present

## 2023-10-01 DIAGNOSIS — M549 Dorsalgia, unspecified: Secondary | ICD-10-CM | POA: Diagnosis not present

## 2023-10-01 DIAGNOSIS — M62830 Muscle spasm of back: Secondary | ICD-10-CM

## 2023-10-01 DIAGNOSIS — M62838 Other muscle spasm: Secondary | ICD-10-CM | POA: Insufficient documentation

## 2023-10-01 MED ORDER — IBUPROFEN 400 MG PO TABS
400.0000 mg | ORAL_TABLET | Freq: Once | ORAL | Status: AC
  Administered 2023-10-01: 400 mg via ORAL
  Filled 2023-10-01: qty 1

## 2023-10-01 MED ORDER — METHOCARBAMOL 750 MG PO TABS: 750.0000 mg | ORAL_TABLET | Freq: Three times a day (TID) | ORAL | 0 refills | Status: AC | PRN

## 2023-10-01 NOTE — ED Provider Notes (Addendum)
[facility] AT [facility] Provider Note   CSN: [id number] Arrival date & time: 10/01/23  0537     History  Chief Complaint  Patient presents with   Torticollis    Derrick Swanson is a 22 y.o. male.  Pt with c/o neck and back pain intermittently in past four months. Hx autism, and indicates occasionally will 'run around' saying back or neck hurts. No recent trauma/injury/fall. No radicular pain. No numbness/weakness or loss of normal functional ability. No fevers.   The history is provided by the patient, a parent and medical records. The history is limited by the condition of the patient.       Home Medications Prior to Admission medications   Medication Sig Start Date End Date Taking? Authorizing Provider  amphetamine-dextroamphetamine (ADDERALL XR) 20 MG 24 hr capsule Take 1 capsule (20 mg total) by mouth daily with breakfast. 01/01/23   Paretta-Leahey, Miachel Roux, NP  amphetamine-dextroamphetamine (ADDERALL XR) 20 MG 24 hr capsule Take 1 capsule (20 mg total) by mouth daily. 01/30/23   Paretta-Leahey, Miachel Roux, NP  GuanFACINE HCl (INTUNIV) 3 MG TB24 Take 1 tablet (3 mg total) by mouth at bedtime. 10/19/22   Paretta-Leahey, Miachel Roux, NP  hydrOXYzine (ATARAX) 25 MG tablet Take 1 tablet (25 mg total) by mouth at bedtime. 10/19/22   Paretta-Leahey, Miachel Roux, NP      Allergies    Amoxicillin    Review of Systems   Review of Systems  Constitutional:  Negative for fever.  HENT:  Negative for sore throat and trouble swallowing.   Eyes:  Negative for redness.  Respiratory:  Negative for cough.   Cardiovascular:  Negative for chest pain.  Gastrointestinal:  Negative for abdominal pain and vomiting.  Genitourinary:  Negative for dysuria.  Musculoskeletal:  Positive for back pain and neck pain.  Neurological:  Negative for weakness, numbness and headaches.    Physical Exam Updated Vital Signs BP (!) 117/96 (BP Location: Right Arm)   Pulse 70   Temp (!) 97.4  F (36.3 C) (Oral)   Resp 16   SpO2 100%  Physical Exam Vitals and nursing note reviewed.  Constitutional:      Appearance: Normal appearance. He is well-developed.  HENT:     Head: Atraumatic.     Right Ear: Tympanic membrane normal.     Left Ear: Tympanic membrane normal.     Nose: Nose normal.     Mouth/Throat:     Mouth: Mucous membranes are moist.     Pharynx: Oropharynx is clear.  Eyes:     General: No scleral icterus.    Conjunctiva/sclera: Conjunctivae normal.     Pupils: Pupils are equal, round, and reactive to light.  Neck:     Trachea: No tracheal deviation.     Comments: No stiffness or rigidity, pt freely moves neck in all directions.  Cardiovascular:     Rate and Rhythm: Normal rate and regular rhythm.     Pulses: Normal pulses.     Heart sounds: Normal heart sounds. No murmur heard.    No friction rub. No gallop.  Pulmonary:     Effort: Pulmonary effort is normal. No accessory muscle usage or respiratory distress.     Breath sounds: Normal breath sounds.  Abdominal:     General: There is no distension.     Palpations: Abdomen is soft.     Tenderness: There is no abdominal tenderness.  Genitourinary:    Comments: No cva  tenderness. Musculoskeletal:        General: No swelling.     Cervical back: Normal range of motion and neck supple. No rigidity.     Right lower leg: No edema.     Left lower leg: No edema.     Comments: CTLS spine, non tender, aligned, no step off. Left trapezius muscular tenderness. No sts. No rash/skin lesions in area of pain.   Lymphadenopathy:     Cervical: No cervical adenopathy.  Skin:    General: Skin is warm and dry.     Findings: No rash.  Neurological:     Mental Status: He is alert.     Comments: Alert, speech clear. Motor/sens grossly intact bil. Steady gait.   Psychiatric:        Mood and Affect: Mood normal.     ED Results / Procedures / Treatments   Labs (all labs ordered are listed, but only abnormal results  are displayed) Labs Reviewed - No data to display  EKG None  Radiology No results found.  Procedures Procedures    Medications Ordered in ED Medications  ibuprofen (ADVIL) tablet 400 mg (has no administration in time range)    ED Course/ Medical Decision Making/ A&P                                 Medical Decision Making Problems Addressed: Autism: chronic illness or injury that poses a threat to life or bodily functions Musculoskeletal back pain: acute illness or injury    Details: Acute/chronic Neck pain: acute illness or injury    Details: Acute/chronic Spasm of left trapezius muscle: acute illness or injury  Amount and/or Complexity of Data Reviewed Independent Historian: parent    Details: hx External Data Reviewed: labs and notes.  Risk Prescription drug management.   No midline/spine tenderness. No fevers. No neuro symptoms, hx trauma, or other red flags.   Exam w muscular tenderness reproducing symptoms.   Ibuprofen po, po fluids/food also ordered w med.   Reviewed nursing notes and prior charts for additional history.   Pt appears comfortable, nad, and stable for d/c.   Rx for home. Rec pcp f/u.        Final Clinical Impression(s) / ED Diagnoses Final diagnoses:  None    Rx / DC Orders ED Discharge Orders     None          Cathren Laine, MD 10/01/23 [phone]

## 2023-10-01 NOTE — Discharge Instructions (Addendum)
It was our pleasure to provide your ER care today - we hope that you feel better.  Take acetaminophen or ibuprofen as need for pain. You may also take robaxin as need as prescribed for muscle pain/spasm - no driving when taking.   Follow up with primary care doctor in 1-2 weeks if symptoms fail to improve/resolve.  Return to ER if worse, new symptoms, fevers, new/severe pain, numbness/weakness, or other emergency concern.

## 2023-10-01 NOTE — ED Triage Notes (Signed)
Mom reports the patient has been having neck pain for 4 months. Patient points to both side of his neck when referring to pain. It also appears that the patient limits his range of motion when turning his head from side to side. Mom states today the patient jumped up and began running around the house, unclothed and spitting, stating his neck was hurting. HX of ADHD and autism.

## 2023-10-09 NOTE — Progress Notes (Unsigned)
    SUBJECTIVE:   CHIEF COMPLAINT / HPI:   AG is a 22yo M w/ hx of autism that p/f neck pain. - Has been about 4 months now. Reports that he is having neck pain. Mom was giving tylenol and was needing to give it every day.  - One morning, pt jumped up and ran through the house due to the pain and was running around naked. Mom was worried so took hm to ED, was given robaxin. Reports robaxin was helpful but they ran out and he is having more pain again. - Denies any preceding trauma. 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    Per chart review: Seen in ED 11/3 for neck pain. Normal exam at that time and recommended conservative management and given robaxin.  OBJECTIVE:   BP 119/83   Pulse (!) 115   Ht 5\' 11"  (1.803 m)   Wt 139 lb (63 kg)   SpO2 100%   BMI 19.39 kg/m   General: Alert, pleasant but shy man. NAD. HEENT: NCAT. MMM. CV: RRR, no murmurs.  Resp: CTAB, no wheezing or crackles. Normal WOB on RA.  Abm: Soft, nontender, nondistended. BS present. Skin: Warm, well perfused  Msk: Tenderness and tightness of R deltoid compared to L deltoid. Tenderness of thoracic paraspinal muscles. Fuill ROM on Shoulders, no tenderness of shoulder joint.   ASSESSMENT/PLAN:   No problem-specific Assessment & Plan notes found for this encounter.   Assessment & Plan Muscle strain History and exam are most consistent with muscle strain of R deltoid with strain of paraspinal muscles too. Reassuringly shoulder and neck have full ROM, and no point tenderness. Also suspect that pt's ASD is also exacerbating his discomfort and behaviors.  - Recommend taking Aleve BID scheduled for next 7 days, then prn dosing - Recommended prn tylenol, icy hot cream, and heat/ice packs for pain - Provided work note to avoid lifting >20lbs. Tachycardia Tachycardic today in clinic. Mom reports that he has a lot of anxiety about coming to clinic. Additonally, pain is likely contributing.  - Recheck  vitals at f/u   Lincoln Brigham, MD [facility]

## 2023-10-10 ENCOUNTER — Encounter: Payer: Self-pay | Admitting: Family Medicine

## 2023-10-10 ENCOUNTER — Ambulatory Visit: Payer: MEDICAID | Admitting: Family Medicine

## 2023-10-10 VITALS — BP 119/83 | HR 115 | Ht 71.0 in | Wt 139.0 lb

## 2023-10-10 DIAGNOSIS — T148XXA Other injury of unspecified body region, initial encounter: Secondary | ICD-10-CM | POA: Diagnosis not present

## 2023-10-10 DIAGNOSIS — Z23 Encounter for immunization: Secondary | ICD-10-CM | POA: Diagnosis not present

## 2023-10-10 DIAGNOSIS — R000 Tachycardia, unspecified: Secondary | ICD-10-CM

## 2023-10-10 NOTE — Patient Instructions (Signed)
Good to see you today - Thank you for coming in  Things we discussed today:  1) Derrick Swanson most likely has a muscle strain of his right deltoid muscle. This is a muscle that helps to support his neck, back, and shoulders. - Take Aleve 500mg  every 12 hours as needed for pain. Take this for 2 weeks, then take 1 week break, then you can take for another week, then take another break. This is to prevent ulcers - Continue to apply icy-hot cream to the area of pain - Can apply a heat pad to the area of pain - You can continue to give tylenol as needed for pain. It is safe to take with Aleve - Avoid strenuous activity with his right arm. Avoid lifting anything heavier than 20 pounds with his right arm.

## 2023-11-06 ENCOUNTER — Other Ambulatory Visit (HOSPITAL_COMMUNITY): Payer: Self-pay

## 2023-11-06 MED ORDER — AMPHETAMINE-DEXTROAMPHET ER 20 MG PO CP24
20.0000 mg | ORAL_CAPSULE | Freq: Every day | ORAL | 0 refills | Status: DC
  Filled 2023-11-06: qty 30, 30d supply, fill #0

## 2023-11-07 ENCOUNTER — Other Ambulatory Visit (HOSPITAL_COMMUNITY): Payer: Self-pay

## 2023-11-07 MED ORDER — AMPHETAMINE-DEXTROAMPHETAMINE 5 MG PO TABS
5.0000 mg | ORAL_TABLET | Freq: Every day | ORAL | 0 refills | Status: DC
  Filled 2023-11-07: qty 30, 30d supply, fill #0

## 2023-12-11 ENCOUNTER — Other Ambulatory Visit (HOSPITAL_COMMUNITY): Payer: Self-pay

## 2023-12-11 MED ORDER — AMPHETAMINE-DEXTROAMPHET ER 20 MG PO CP24
20.0000 mg | ORAL_CAPSULE | Freq: Every day | ORAL | 0 refills | Status: DC
  Filled 2023-12-11: qty 30, 30d supply, fill #0

## 2023-12-11 MED ORDER — AMPHETAMINE-DEXTROAMPHETAMINE 5 MG PO TABS
5.0000 mg | ORAL_TABLET | Freq: Every day | ORAL | 0 refills | Status: DC
  Filled 2023-12-11: qty 30, 30d supply, fill #0

## 2024-01-16 ENCOUNTER — Other Ambulatory Visit (HOSPITAL_COMMUNITY): Payer: Self-pay

## 2024-01-16 MED ORDER — AMPHETAMINE-DEXTROAMPHETAMINE 5 MG PO TABS
5.0000 mg | ORAL_TABLET | Freq: Every day | ORAL | 0 refills | Status: DC
  Filled 2024-01-16: qty 30, 30d supply, fill #0

## 2024-01-16 MED ORDER — AMPHETAMINE-DEXTROAMPHET ER 20 MG PO CP24
20.0000 mg | ORAL_CAPSULE | Freq: Every day | ORAL | 0 refills | Status: DC
  Filled 2024-01-16: qty 30, 30d supply, fill #0

## 2024-01-29 ENCOUNTER — Encounter: Payer: Self-pay | Admitting: Nurse Practitioner

## 2024-01-29 ENCOUNTER — Ambulatory Visit (INDEPENDENT_AMBULATORY_CARE_PROVIDER_SITE_OTHER): Payer: Medicare (Managed Care) | Admitting: Nurse Practitioner

## 2024-01-29 VITALS — BP 121/69 | HR 99 | Temp 97.0°F | Wt 144.0 lb

## 2024-01-29 DIAGNOSIS — H6123 Impacted cerumen, bilateral: Secondary | ICD-10-CM | POA: Diagnosis not present

## 2024-01-29 DIAGNOSIS — F902 Attention-deficit hyperactivity disorder, combined type: Secondary | ICD-10-CM

## 2024-01-29 NOTE — Assessment & Plan Note (Signed)
 Currently well-controlled on Adderall 25 mg daily Continue current medication and maintain close follow-up with psychiatrist

## 2024-01-29 NOTE — Assessment & Plan Note (Addendum)
 Bilateral ears were flushed today, procedure was well-tolerated The left ear appears impacted after flushing, could be a foreign body patient referred to ENT.  Right ear was clean after flushing, tympanic membrane on the right was normal.

## 2024-01-29 NOTE — Patient Instructions (Addendum)
 OK to use Debrox (peroxide) in the ear to loosen up wax.  Do not use Q-tips as this can impact wax further.    It is important that you exercise regularly at least 30 minutes 5 times a week as tolerated  Think about what you will eat, plan ahead. Choose " clean, green, fresh or frozen" over canned, processed or packaged foods which are more sugary, salty and fatty. 70 to 75% of food eaten should be vegetables and fruit. Three meals at set times with snacks allowed between meals, but they must be fruit or vegetables. Aim to eat over a 12 hour period , example 7 am to 7 pm, and STOP after  your last meal of the day. Drink water,generally about 64 ounces per day, no other drink is as healthy. Fruit juice is best enjoyed in a healthy way, by EATING the fruit.  Thanks for choosing [facility] we consider it a privelige to serve you.

## 2024-01-29 NOTE — Progress Notes (Signed)
 New Patient Office Visit  Subjective:  Patient ID: Derrick Swanson, male    DOB: [birth year]  Age: 23 y.o. MRN: [id number]  CC:  Chief Complaint  Patient presents with   Establish Care    HPI Derrick Swanson is a 23 y.o. male  has a past medical history of ADHD (attention deficit hyperactivity disorder) and Autism. Patient  presents for establishing care.  Patient is accompanied by his aunt who is his POA  Ms.Pamela Womack ,patient  lives with aunt. Patient is alert and oriented times 3.  Previous PCP [name] Md   ADHD is well-controlled on Adderall 25 mg and guanfacine.  He is followed by psychiatrist  His aunt stated that patient had told her that he inserted something in his ear yesterday, the patient denies ear pain, ear discharge, trouble hearing.   They denied any adverse reactions to current medications.   Past Medical History:  Diagnosis Date   ADHD (attention deficit hyperactivity disorder)    Autism    Mild    Past Surgical History:  Procedure Laterality Date   DENTAL SURGERY     3 teeth pulled at 23 yo   INNER EAR SURGERY     tubes    Family History  Problem Relation Age of Onset   Alzheimer's disease Maternal Grandmother     Social History   Socioeconomic History   Marital status: Single    Spouse name: Not on file   Number of children: Not on file   Years of education: Not on file   Highest education level: 12th grade  Occupational History    Employer: [employer]    Comment: part-time, cleans tables  Tobacco Use   Smoking status: Never   Smokeless tobacco: Never  Substance and Sexual Activity   Alcohol use: Never   Drug use: Never   Sexual activity: Never  Other Topics Concern   Not on file  Social History Narrative   Lives with his mother.    Social Drivers of Health   Financial Resource Strain: Patient Declined (01/26/2024)   Overall Financial Resource Strain (CARDIA)    Difficulty of Paying Living Expenses: Patient declined  Food  Insecurity: No Food Insecurity (01/26/2024)   Hunger Vital Sign    Worried About Running Out of Food in the Last Year: Never true    Ran Out of Food in the Last Year: Never true  Transportation Needs: No Transportation Needs (01/26/2024)   PRAPARE - Administrator, Civil Service (Medical): No    Lack of Transportation (Non-Medical): No  Physical Activity: Unknown (01/26/2024)   Exercise Vital Sign    Days of Exercise per Week: 0 days    Minutes of Exercise per Session: Not on file  Stress: Patient Declined (01/26/2024)   [employer] of Occupational Health - Occupational Stress Questionnaire    Feeling of Stress : Patient declined  Social Connections: Unknown (01/26/2024)   Social Connection and Isolation Panel [NHANES]    Frequency of Communication with Friends and Family: Never    Frequency of Social Gatherings with Friends and Family: Patient declined    Attends Religious Services: Never    Database administrator or Organizations: No    Attends Engineer, structural: Not on file    Marital Status: Never married  Intimate Partner Violence: Not on file    ROS Review of Systems  Constitutional:  Negative for appetite change, chills, fatigue and fever.  HENT:  Negative  for congestion, postnasal drip, rhinorrhea and sneezing.   Respiratory:  Negative for cough, shortness of breath and wheezing.   Cardiovascular:  Negative for chest pain, palpitations and leg swelling.  Gastrointestinal:  Negative for abdominal pain, constipation, nausea and vomiting.  Genitourinary:  Negative for difficulty urinating, dysuria, flank pain and frequency.  Musculoskeletal:  Negative for arthralgias, back pain, joint swelling and myalgias.  Skin:  Negative for color change, pallor, rash and wound.  Neurological:  Negative for dizziness, facial asymmetry, weakness, numbness and headaches.  Psychiatric/Behavioral:  Negative for behavioral problems, confusion, self-injury and suicidal  ideas.     Objective:   Today's Vitals: BP 121/69   Pulse 99   Temp (!) 97 F (36.1 C)   Wt 144 lb (65.3 kg)   SpO2 100%   BMI 20.08 kg/m   Physical Exam Vitals and nursing note reviewed.  Constitutional:      General: He is not in acute distress.    Appearance: Normal appearance. He is not ill-appearing, toxic-appearing or diaphoretic.  HENT:     Right Ear: Tympanic membrane, ear canal and external ear normal. There is no impacted cerumen.     Left Ear: Tympanic membrane, ear canal and external ear normal. There is no impacted cerumen.     Mouth/Throat:     Mouth: Mucous membranes are moist.     Pharynx: Oropharynx is clear. No oropharyngeal exudate or posterior oropharyngeal erythema.  Eyes:     General: No scleral icterus.       Right eye: No discharge.        Left eye: No discharge.     Extraocular Movements: Extraocular movements intact.     Conjunctiva/sclera: Conjunctivae normal.  Cardiovascular:     Rate and Rhythm: Normal rate and regular rhythm.     Pulses: Normal pulses.     Heart sounds: Normal heart sounds. No murmur heard.    No friction rub. No gallop.  Pulmonary:     Effort: Pulmonary effort is normal. No respiratory distress.     Breath sounds: Normal breath sounds. No stridor. No wheezing, rhonchi or rales.  Chest:     Chest wall: No tenderness.  Abdominal:     General: There is no distension.     Palpations: Abdomen is soft.     Tenderness: There is no abdominal tenderness. There is no right CVA tenderness, left CVA tenderness or guarding.  Musculoskeletal:        General: No swelling, tenderness, deformity or signs of injury.     Right lower leg: No edema.     Left lower leg: No edema.  Skin:    General: Skin is warm and dry.     Capillary Refill: Capillary refill takes less than 2 seconds.     Coloration: Skin is not jaundiced or pale.     Findings: No bruising, erythema or lesion.  Neurological:     Mental Status: He is alert and oriented  to person, place, and time.     Motor: No weakness.     Coordination: Coordination normal.     Gait: Gait normal.  Psychiatric:        Mood and Affect: Mood normal.        Behavior: Behavior normal.        Thought Content: Thought content normal.        Judgment: Judgment normal.     Assessment & Plan:   Problem List Items Addressed This Visit  Nervous and Auditory   Impacted cerumen of both ears - Primary   Bilateral ears were flushed today, procedure was well-tolerated The left ear appears impacted after flushing, could be a foreign body patient referred to ENT.  Right ear was clean after flushing, tympanic membrane on the right was normal.      Relevant Orders   Ambulatory referral to ENT     Other   ADHD (attention deficit hyperactivity disorder), combined type   Currently well-controlled on Adderall 25 mg daily Continue current medication and maintain close follow-up with psychiatrist       Outpatient Encounter Medications as of 01/29/2024  Medication Sig   amphetamine-dextroamphetamine (ADDERALL XR) 20 MG 24 hr capsule Take 1 capsule (20 mg total) by mouth daily with breakfast.   amphetamine-dextroamphetamine (ADDERALL XR) 20 MG 24 hr capsule Take 1 capsule (20 mg total) by mouth daily.   amphetamine-dextroamphetamine (ADDERALL XR) 20 MG 24 hr capsule Take 1 capsule (20 mg total) by mouth daily.   amphetamine-dextroamphetamine (ADDERALL) 5 MG tablet Take 1 tablet (5 mg total) by mouth daily.   GuanFACINE HCl (INTUNIV) 3 MG TB24 Take 1 tablet (3 mg total) by mouth at bedtime.   hydrOXYzine (ATARAX) 25 MG tablet Take 1 tablet (25 mg total) by mouth at bedtime.   methocarbamol (ROBAXIN) 750 MG tablet Take 1 tablet (750 mg total) by mouth 3 (three) times daily as needed (muscle spasm/pain).   No facility-administered encounter medications on file as of 01/29/2024.    Follow-up: Return in about 6 months (around 07/31/2024) for CPE.   Donell Beers, FNP

## 2024-01-30 ENCOUNTER — Encounter (INDEPENDENT_AMBULATORY_CARE_PROVIDER_SITE_OTHER): Payer: Self-pay | Admitting: Otolaryngology

## 2024-02-02 ENCOUNTER — Encounter (INDEPENDENT_AMBULATORY_CARE_PROVIDER_SITE_OTHER): Payer: Self-pay

## 2024-02-02 ENCOUNTER — Ambulatory Visit (INDEPENDENT_AMBULATORY_CARE_PROVIDER_SITE_OTHER): Payer: Medicare (Managed Care) | Admitting: Physician Assistant

## 2024-02-02 VITALS — BP 121/84 | HR 121 | Ht 71.0 in | Wt 144.0 lb

## 2024-02-02 DIAGNOSIS — T162XXA Foreign body in left ear, initial encounter: Secondary | ICD-10-CM

## 2024-02-05 ENCOUNTER — Other Ambulatory Visit (HOSPITAL_COMMUNITY): Payer: Self-pay

## 2024-02-05 MED ORDER — AMPHETAMINE-DEXTROAMPHET ER 25 MG PO CP24
25.0000 mg | ORAL_CAPSULE | Freq: Every morning | ORAL | 0 refills | Status: DC
  Filled 2024-02-05 – 2024-02-13 (×2): qty 30, 30d supply, fill #0

## 2024-02-05 MED ORDER — AMPHETAMINE-DEXTROAMPHET ER 25 MG PO CP24
25.0000 mg | ORAL_CAPSULE | Freq: Every morning | ORAL | 0 refills | Status: DC
  Filled 2024-03-19: qty 30, 30d supply, fill #0

## 2024-02-05 MED ORDER — AMPHETAMINE-DEXTROAMPHET ER 25 MG PO CP24
25.0000 mg | ORAL_CAPSULE | Freq: Every morning | ORAL | 0 refills | Status: DC
  Filled 2024-04-18: qty 30, 30d supply, fill #0

## 2024-02-05 NOTE — Progress Notes (Signed)
 Dear Dr. Geoffery Spruce, Here is my assessment for our mutual patient, Derrick Swanson. Thank you for allowing me the opportunity to care for your patient. Please do not hesitate to contact me should you have any other questions. Sincerely, Burna Forts PA-C  Otolaryngology Clinic Note Referring provider: Dr. Geoffery Spruce HPI:  Derrick Swanson is a 23 y.o. male kindly referred by Dr. Geoffery Spruce   The patient is a 23 year old gentleman accompanied by his mother who reports that he play something into his left ear.  She notes that last week he placed a piece of paper into his ear.  They were seen by their nurse practitioner who was unable to remove it and referred him to our office.  The patient denies any pain, denies any fever, no drainage, no hearing loss.  No history of the same.   Independent Review of Additional Tests or Records:  Office visit on 01/29/2024 with nurse practitioner [facility]   PMH/Meds/All/SocHx/FamHx/ROS:   Past Medical History:  Diagnosis Date   ADHD (attention deficit hyperactivity disorder)    Autism    Mild     Past Surgical History:  Procedure Laterality Date   DENTAL SURGERY     3 teeth pulled at 23 yo   INNER EAR SURGERY     tubes    Family History  Problem Relation Age of Onset   Alzheimer's disease Maternal Grandmother      Social Connections: Unknown (01/26/2024)   Social Connection and Isolation Panel [NHANES]    Frequency of Communication with Friends and Family: Never    Frequency of Social Gatherings with Friends and Family: Patient declined    Attends Religious Services: Never    Database administrator or Organizations: No    Attends Engineer, structural: Not on file    Marital Status: Never married      Current Outpatient Medications:    amphetamine-dextroamphetamine (ADDERALL XR) 20 MG 24 hr capsule, Take 1 capsule (20 mg total) by mouth daily., Disp: 30 capsule, Rfl: 0   amphetamine-dextroamphetamine (ADDERALL XR) 20 MG 24 hr capsule, Take 1 capsule  (20 mg total) by mouth daily., Disp: 30 capsule, Rfl: 0   amphetamine-dextroamphetamine (ADDERALL) 5 MG tablet, Take 1 tablet (5 mg total) by mouth daily., Disp: 30 tablet, Rfl: 0   GuanFACINE HCl (INTUNIV) 3 MG TB24, Take 1 tablet (3 mg total) by mouth at bedtime., Disp: 90 tablet, Rfl: 1   hydrOXYzine (ATARAX) 25 MG tablet, Take 1 tablet (25 mg total) by mouth at bedtime., Disp: 90 tablet, Rfl: 1   methocarbamol (ROBAXIN) 750 MG tablet, Take 1 tablet (750 mg total) by mouth 3 (three) times daily as needed (muscle spasm/pain)., Disp: 15 tablet, Rfl: 0   [START ON 04/12/2024] amphetamine-dextroamphetamine (ADDERALL XR) 25 MG 24 hr capsule, Take 1 capsule by mouth every morning., Disp: 30 capsule, Rfl: 0   [START ON 03/13/2024] amphetamine-dextroamphetamine (ADDERALL XR) 25 MG 24 hr capsule, Take 1 capsule by mouth every morning., Disp: 30 capsule, Rfl: 0   amphetamine-dextroamphetamine (ADDERALL XR) 25 MG 24 hr capsule, Take 1 capsule by mouth every morning., Disp: 30 capsule, Rfl: 0   Physical Exam:   BP 121/84 (BP Location: Right Arm, Patient Position: Sitting, Cuff Size: Large)   Pulse (!) 121   Ht 5\' 11"  (1.803 m)   Wt 144 lb (65.3 kg)   SpO2 98%   BMI 20.08 kg/m   Pertinent Findings  CN II-XII intact Left external auditory canal occluded with paper, right clear,  TM intact Weber 512: equal Rinne 512: AC > BC b/l  Anterior rhinoscopy: Septum  No lesions of oral cavity/oropharynx; dentition WNL No obviously palpable neck masses/lymphadenopathy/thyromegaly No respiratory distress or stridor  Seprately Identifiable Procedures:  Procedure: Bilateral ear microscopy and foreign body removal using microscope (CPT 69200) - Mod 25 Pre-procedure diagnosis:  Ear Foreign body Post-procedure diagnosis: same Indication:  Ear foreign body; given patient's otologic complaints and physical exam, bilateral otologic examination using microscope was performed and ear foreign body  removed  Procedure: Patient was placed semi-recumbent. Both ear canals were examined using the microscope with findings above. A piece of paper was removed from the left ear canal.  improvement in EAC examination and patency. Left: EAC was patent. TM was intact . Middle ear was aerated. Drainage: none Right: EAC was patent. TM was intact . Middle ear was aerated . Drainage: none Foreign body was removed. There was no trauma to the [facility]. Patient tolerated the procedure well.    Impression & Plans:  Derrick Swanson is a 23 y.o. male with the following   Foreign body left ear-  A piece of paper was removed from the left external auditory canal, he tolerated this well.  They may return as needed.   - f/u PRN   Thank you for allowing me the opportunity to care for your patient. Please do not hesitate to contact me should you have any other questions.  Sincerely, Burna Forts PA-C [facility] Phone: [phone] Fax: [phone]  02/05/2024, 2:27 PM

## 2024-02-13 ENCOUNTER — Other Ambulatory Visit (HOSPITAL_COMMUNITY): Payer: Self-pay

## 2024-02-14 ENCOUNTER — Other Ambulatory Visit (HOSPITAL_COMMUNITY): Payer: Self-pay

## 2024-03-19 ENCOUNTER — Other Ambulatory Visit (HOSPITAL_COMMUNITY): Payer: Self-pay

## 2024-03-19 ENCOUNTER — Other Ambulatory Visit: Payer: Self-pay

## 2024-03-19 ENCOUNTER — Encounter (HOSPITAL_COMMUNITY): Payer: Self-pay

## 2024-04-18 ENCOUNTER — Other Ambulatory Visit (HOSPITAL_COMMUNITY): Payer: Self-pay

## 2024-04-18 ENCOUNTER — Other Ambulatory Visit: Payer: Self-pay

## 2024-05-07 ENCOUNTER — Other Ambulatory Visit (HOSPITAL_COMMUNITY): Payer: Self-pay

## 2024-05-07 MED ORDER — AMPHETAMINE-DEXTROAMPHET ER 25 MG PO CP24
25.0000 mg | ORAL_CAPSULE | Freq: Every day | ORAL | 0 refills | Status: DC
  Filled 2024-06-27: qty 30, 30d supply, fill #0

## 2024-05-07 MED ORDER — AMPHETAMINE-DEXTROAMPHET ER 25 MG PO CP24
25.0000 mg | ORAL_CAPSULE | Freq: Every morning | ORAL | 0 refills | Status: DC
  Filled 2024-05-21: qty 30, 30d supply, fill #0

## 2024-05-07 MED ORDER — AMPHETAMINE-DEXTROAMPHET ER 25 MG PO CP24
25.0000 mg | ORAL_CAPSULE | Freq: Every day | ORAL | 0 refills | Status: DC
  Filled 2024-07-30: qty 30, 30d supply, fill #0

## 2024-05-22 ENCOUNTER — Other Ambulatory Visit (HOSPITAL_COMMUNITY): Payer: Self-pay

## 2024-06-27 ENCOUNTER — Other Ambulatory Visit (HOSPITAL_COMMUNITY): Payer: Self-pay

## 2024-07-30 ENCOUNTER — Other Ambulatory Visit (HOSPITAL_COMMUNITY): Payer: Self-pay

## 2024-07-31 ENCOUNTER — Ambulatory Visit (INDEPENDENT_AMBULATORY_CARE_PROVIDER_SITE_OTHER): Payer: Medicare (Managed Care) | Admitting: Nurse Practitioner

## 2024-07-31 ENCOUNTER — Telehealth: Payer: Self-pay

## 2024-07-31 ENCOUNTER — Other Ambulatory Visit (HOSPITAL_COMMUNITY): Payer: Self-pay

## 2024-07-31 ENCOUNTER — Encounter: Payer: Self-pay | Admitting: Nurse Practitioner

## 2024-07-31 VITALS — BP 124/73 | HR 109 | Temp 97.3°F | Wt 145.0 lb

## 2024-07-31 DIAGNOSIS — F902 Attention-deficit hyperactivity disorder, combined type: Secondary | ICD-10-CM

## 2024-07-31 DIAGNOSIS — Z130 Encounter for screening for diseases of the blood and blood-forming organs and certain disorders involving the immune mechanism: Secondary | ICD-10-CM

## 2024-07-31 DIAGNOSIS — Z0000 Encounter for general adult medical examination without abnormal findings: Secondary | ICD-10-CM | POA: Diagnosis not present

## 2024-07-31 DIAGNOSIS — Z1321 Encounter for screening for nutritional disorder: Secondary | ICD-10-CM

## 2024-07-31 DIAGNOSIS — Z13228 Encounter for screening for other metabolic disorders: Secondary | ICD-10-CM

## 2024-07-31 DIAGNOSIS — H6122 Impacted cerumen, left ear: Secondary | ICD-10-CM | POA: Insufficient documentation

## 2024-07-31 DIAGNOSIS — Z1329 Encounter for screening for other suspected endocrine disorder: Secondary | ICD-10-CM | POA: Diagnosis not present

## 2024-07-31 MED ORDER — AMPHETAMINE-DEXTROAMPHET ER 25 MG PO CP24
25.0000 mg | ORAL_CAPSULE | Freq: Every day | ORAL | 0 refills | Status: DC
  Filled 2024-07-31: qty 30, 30d supply, fill #0

## 2024-07-31 MED ORDER — AMPHETAMINE-DEXTROAMPHET ER 25 MG PO CP24: 25.0000 mg | ORAL_CAPSULE | Freq: Every day | ORAL | 0 refills | Status: DC

## 2024-07-31 NOTE — Progress Notes (Signed)
 SABRA

## 2024-07-31 NOTE — Assessment & Plan Note (Addendum)
  Discussed importance of healthy lifestyle and regular follow-ups with mental health specialists. - Encourage 30 minutes of exercise, five days a week. - Recommend a balanced diet with three healthy meals daily. - Advise drinking at least 64 ounces of water daily. - Encourage 7-8 hours of sleep per night. - Recommend regular use of seatbelts. - Plan to administer flu vaccine in October. - Consider meningococcal B vaccine if not already received.

## 2024-07-31 NOTE — Telephone Encounter (Signed)
 Copied from CRM #[id number]. Topic: Clinical - Medication Question >> Jul 31, 2024 11:59 AM Larissa RAMAN wrote: Reason for CRM: Harlene with [facility] requesting to speak with PCP/nurse regarding medication, amphetamine -dextroamphetamine  (ADDERALL XR) 25 MG 24 hr capsule. Medication was discontinued, but mother is at pharmacy to pickup medication for patient. Requesting a callback as soon as possible.  Callback # [phone]  ----------------------------------------------------------------------- From previous Reason for Contact - Other: Reason for CRM:

## 2024-07-31 NOTE — Telephone Encounter (Signed)
 Pt mother would like a refill on Adderall. Please advise [facility]

## 2024-07-31 NOTE — Assessment & Plan Note (Signed)
 Impacted cerumen, right ear Small amount of cerumen in the right ear; left ear is clear. Condition not severe. - Recommend Debrox wax remover solution. - Advise against using Q-tips.

## 2024-07-31 NOTE — Progress Notes (Signed)
 Complete physical exam  Patient: Derrick Swanson   DOB: 2001/06/30   22 y.o. Male  MRN: [id number]  Subjective:    Chief Complaint  Patient presents with   Annual Exam    Not fasting     Discussed the use of AI scribe software for clinical note transcription with the patient, who gave verbal consent to proceed.  History of Present Illness Derrick Swanson is a 23 year old male  has a past medical history of ADHD (attention deficit hyperactivity disorder) and Autism. who presents for a routine follow-up visit. He is accompanied by his mother.  He is currently taking Adderall 25 mg once daily and Guanfacine  4 mg in the morning for ADHD. Additionally, he takes hydroxyzine  at bedtime.   He has a history of back pain, which is not currently problematic.He is not using any muscle relaxants at this time. Occasionally, he experiences cramps, which he attributes to insufficient hydration. He denies fever, chills, chest pain, shortness of breath, constipation, diarrhea, cough, wheezing, nausea, or vomiting. He reports sleeping well at night, though sometimes he wakes up with cramps.  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 He walks around the block two to three times a week. He is particular about his diet, typically consuming two meals a day, with a substantial breakfast before taking his medication. He lives with his mother and does not use drugs, alcohol, smoke, or vape. He recently obtained new prescription glasses after visiting the eye doctor last week.     Assessment & Plan      Most recent fall risk assessment:     No data to display           Most recent depression screenings:    07/31/2024    9:04 AM 01/29/2024    1:39 PM  PHQ 2/9 Scores  PHQ - 2 Score 0 0  PHQ- 9 Score 0         Patient Care Team: Diamonds Lippard R, FNP as PCP - General (Nurse Practitioner)   Outpatient Medications Prior to Visit  Medication Sig    guanFACINE  (INTUNIV ) 4 MG TB24 ER tablet Take 4 mg by mouth daily.   hydrOXYzine  (ATARAX ) 25 MG tablet Take 1 tablet (25 mg total) by mouth at bedtime.   [DISCONTINUED] amphetamine -dextroamphetamine  (ADDERALL XR) 25 MG 24 hr capsule Take 1 capsule by mouth every morning.   [DISCONTINUED] amphetamine -dextroamphetamine  (ADDERALL XR) 25 MG 24 hr capsule Take 1 capsule by mouth every morning.   [DISCONTINUED] amphetamine -dextroamphetamine  (ADDERALL XR) 25 MG 24 hr capsule Take 1 capsule by mouth daily.   [DISCONTINUED] amphetamine -dextroamphetamine  (ADDERALL XR) 25 MG 24 hr capsule Take 1 capsule by mouth daily.   [DISCONTINUED] amphetamine -dextroamphetamine  (ADDERALL) 5 MG tablet Take 1 tablet (5 mg total) by mouth daily.   [DISCONTINUED] GuanFACINE  HCl (INTUNIV ) 3 MG TB24 Take 1 tablet (3 mg total) by mouth at bedtime.   methocarbamol  (ROBAXIN ) 750 MG tablet Take 1 tablet (750 mg total) by mouth 3 (three) times daily as needed (muscle spasm/pain). (Patient not taking: Reported on 07/31/2024)   [DISCONTINUED] amphetamine -dextroamphetamine  (ADDERALL XR) 20 MG 24 hr capsule Take 1 capsule (20 mg total) by mouth daily. (Patient not taking: Reported on 07/31/2024)   [DISCONTINUED] amphetamine -dextroamphetamine  (ADDERALL XR) 20 MG 24 hr capsule Take 1 capsule (20 mg total) by mouth daily. (Patient not taking: Reported on 07/31/2024)   [DISCONTINUED] amphetamine -dextroamphetamine  (ADDERALL XR) 25 MG 24 hr capsule Take 1  capsule by mouth every morning. (Patient not taking: Reported on 07/31/2024)   No facility-administered medications prior to visit.    Review of Systems  Constitutional:  Negative for appetite change, chills, fatigue and fever.  HENT:  Negative for congestion, postnasal drip, rhinorrhea and sneezing.   Eyes:  Negative for pain, discharge and itching.  Respiratory:  Negative for cough, shortness of breath and wheezing.   Cardiovascular:  Negative for chest pain, palpitations and leg swelling.   Gastrointestinal:  Negative for abdominal pain, constipation, nausea and vomiting.  Endocrine: Negative for cold intolerance, heat intolerance and polydipsia.  Genitourinary:  Negative for difficulty urinating, dysuria, flank pain and frequency.  Musculoskeletal:  Negative for arthralgias, back pain, joint swelling and myalgias.  Skin:  Negative for color change, pallor, rash and wound.  Allergic/Immunologic: Negative for immunocompromised state.  Neurological:  Negative for dizziness, facial asymmetry, weakness, numbness and headaches.  Psychiatric/Behavioral:  Negative for behavioral problems, confusion, self-injury and suicidal ideas.        Objective:     BP 124/73   Pulse (!) 109   Temp (!) 97.3 F (36.3 C)   Wt 145 lb (65.8 kg)   SpO2 100%   BMI 20.22 kg/m    Physical Exam Vitals and nursing note reviewed. Exam conducted with a chaperone present.  Constitutional:      General: He is not in acute distress.    Appearance: Normal appearance. He is not ill-appearing, toxic-appearing or diaphoretic.  HENT:     Right Ear: Tympanic membrane, ear canal and external ear normal. There is no impacted cerumen.     Left Ear: Tympanic membrane, ear canal and external ear normal. There is impacted cerumen.     Nose: Nose normal. No congestion or rhinorrhea.     Mouth/Throat:     Mouth: Mucous membranes are moist.     Pharynx: Oropharynx is clear. No oropharyngeal exudate or posterior oropharyngeal erythema.  Eyes:     General: No scleral icterus.       Right eye: No discharge.        Left eye: No discharge.     Extraocular Movements: Extraocular movements intact.     Conjunctiva/sclera: Conjunctivae normal.  Neck:     Vascular: No carotid bruit.  Cardiovascular:     Rate and Rhythm: Normal rate and regular rhythm.     Pulses: Normal pulses.     Heart sounds: Normal heart sounds. No murmur heard.    No friction rub. No gallop.  Pulmonary:     Effort: Pulmonary effort is  normal. No respiratory distress.     Breath sounds: Normal breath sounds. No stridor. No wheezing, rhonchi or rales.  Chest:     Chest wall: No tenderness.  Abdominal:     General: Bowel sounds are normal. There is no distension.     Palpations: Abdomen is soft. There is no mass.     Tenderness: There is no abdominal tenderness. There is no right CVA tenderness, left CVA tenderness, guarding or rebound.     Hernia: No hernia is present.  Musculoskeletal:        General: No swelling, tenderness, deformity or signs of injury.     Cervical back: Normal range of motion and neck supple. No rigidity or tenderness.     Right lower leg: No edema.     Left lower leg: No edema.  Lymphadenopathy:     Cervical: No cervical adenopathy.  Skin:    General: Skin is  warm and dry.     Capillary Refill: Capillary refill takes 2 to 3 seconds.     Coloration: Skin is not jaundiced or pale.     Findings: No bruising, erythema, lesion or rash.  Neurological:     Mental Status: He is alert and oriented to person, place, and time. Mental status is at baseline.     Cranial Nerves: No cranial nerve deficit.     Motor: No weakness.     Gait: Gait normal.  Psychiatric:        Mood and Affect: Mood normal.        Behavior: Behavior normal.        Thought Content: Thought content normal.        Judgment: Judgment normal.     No results found for any visits on 07/31/24.     Assessment & Plan:    Routine Health Maintenance and Physical Exam  Immunization History  Administered Date(s) Administered   DTaP 10/16/2001, 12/18/2001, 02/21/2002, 11/07/2002, 10/06/2005   HIB (PRP-OMP) 10/16/2001, 12/18/2001, 02/21/2002, 11/07/2002   HPV Quadrivalent 03/12/2013, 06/07/2013, 10/08/2013   Hepatitis A 10/06/2005, 09/27/2006   Hepatitis B 01/15/2001, 10/16/2001, 02/21/2002   IPV 10/16/2001, 12/18/2001, 08/08/2002, 10/06/2005   Influenza Nasal 12/15/2009, 12/21/2010, 02/09/2012   Influenza Split 10/11/2007,  12/06/2007, 08/30/2008   Influenza,Quad,Nasal, Live 09/12/2013, 12/22/2014   Influenza,inj,Quad PF,6+ Mos 11/15/2016, 12/28/2017, 10/31/2018, 09/11/2019, 11/30/2020, 09/20/2021, 09/05/2022   Influenza-Unspecified 09/01/2023   MMR 08/08/2002, 10/06/2005   Meningococcal Conjugate 03/12/2013, 07/24/2019   Pfizer Covid-19 Vaccine Bivalent Booster 31yrs & up 02/15/2022   Pfizer(Comirnaty)Fall Seasonal Vaccine 12 years and older 09/01/2023   Pneumococcal Conjugate-13 10/16/2001, 12/18/2001, 02/21/2002, 08/08/2002   Td 02/09/2012   Tdap 02/09/2012, 10/10/2023   Varicella 08/08/2002, 10/06/2005    Health Maintenance  Topic Date Due   Medicare Annual Wellness (AWV)  Never done   Meningococcal B Vaccine (1 of 2 - Standard) Never done   COVID-19 Vaccine (3 - Pfizer risk series) 09/29/2023   INFLUENZA VACCINE  06/28/2024   DTaP/Tdap/Td (9 - Td or Tdap) 10/09/2033   Pneumococcal Vaccine  Completed   Hepatitis B Vaccines 19-59 Average Risk  Completed   HPV VACCINES  Completed   Hepatitis C Screening  Completed   HIV Screening  Completed    Discussed health benefits of physical activity, and encouraged him to engage in regular exercise appropriate for his age and condition.  Problem List Items Addressed This Visit       Nervous and Auditory   Impacted cerumen, left ear   Impacted cerumen, right ear Small amount of cerumen in the right ear; left ear is clear. Condition not severe. - Recommend Debrox wax remover solution. - Advise against using Q-tips.        Other   ADHD (attention deficit hyperactivity disorder), combined type   Relevant Medications   amphetamine -dextroamphetamine  (ADDERALL XR) 25 MG 24 hr capsule   Annual physical exam - Primary    Discussed importance of healthy lifestyle and regular follow-ups with mental health specialists. - Encourage 30 minutes of exercise, five days a week. - Recommend a balanced diet with three healthy meals daily. - Advise drinking at  least 64 ounces of water daily. - Encourage 7-8 hours of sleep per night. - Recommend regular use of seatbelts. - Plan to administer flu vaccine in October. - Consider meningococcal B vaccine if not already received.      Other Visit Diagnoses       Screening for endocrine, nutritional,  metabolic and immunity disorder       Relevant Orders   CBC   CMP14+EGFR   Lipid panel      Return in about 1 year (around 07/31/2025) for FASTING LABS THIS WEEK, CPE.     Glenora Morocho R Zadrian Mccauley, FNP

## 2024-07-31 NOTE — Patient Instructions (Signed)
 OK to use Debrox (peroxide) in the ear to loosen up wax.  Do not use Q-tips as this can impact wax further.    It is important that you exercise regularly at least 30 minutes 5 times a week as tolerated  Think about what you will eat, plan ahead. Choose " clean, green, fresh or frozen" over canned, processed or packaged foods which are more sugary, salty and fatty. 70 to 75% of food eaten should be vegetables and fruit. Three meals at set times with snacks allowed between meals, but they must be fruit or vegetables. Aim to eat over a 12 hour period , example 7 am to 7 pm, and STOP after  your last meal of the day. Drink water,generally about 64 ounces per day, no other drink is as healthy. Fruit juice is best enjoyed in a healthy way, by EATING the fruit.  Thanks for choosing [facility] we consider it a privelige to serve you.

## 2024-08-02 ENCOUNTER — Other Ambulatory Visit: Payer: Medicare (Managed Care)

## 2024-08-02 DIAGNOSIS — Z130 Encounter for screening for diseases of the blood and blood-forming organs and certain disorders involving the immune mechanism: Secondary | ICD-10-CM

## 2024-08-03 LAB — CBC
Hematocrit: 40.7 % (ref 37.5–51.0)
Hemoglobin: 13.4 g/dL (ref 13.0–17.7)
MCH: 27.6 pg (ref 26.6–33.0)
MCHC: 32.9 g/dL (ref 31.5–35.7)
MCV: 84 fL (ref 79–97)
Platelets: 203 x10E3/uL (ref 150–450)
RBC: 4.85 x10E6/uL (ref 4.14–5.80)
RDW: 13.2 % (ref 11.6–15.4)
WBC: 3.8 x10E3/uL (ref 3.4–10.8)

## 2024-08-03 LAB — CMP14+EGFR
ALT: 15 IU/L (ref 0–44)
AST: 18 IU/L (ref 0–40)
Albumin: 4.5 g/dL (ref 4.3–5.2)
Alkaline Phosphatase: 86 IU/L (ref 44–121)
BUN/Creatinine Ratio: 14 (ref 9–20)
BUN: 13 mg/dL (ref 6–20)
Bilirubin Total: 0.6 mg/dL (ref 0.0–1.2)
CO2: 22 mmol/L (ref 20–29)
Calcium: 10 mg/dL (ref 8.7–10.2)
Chloride: 103 mmol/L (ref 96–106)
Creatinine, Ser: 0.95 mg/dL (ref 0.76–1.27)
Globulin, Total: 2.5 g/dL (ref 1.5–4.5)
Glucose: 84 mg/dL (ref 70–99)
Potassium: 4.3 mmol/L (ref 3.5–5.2)
Sodium: 141 mmol/L (ref 134–144)
Total Protein: 7 g/dL (ref 6.0–8.5)
eGFR: 116 mL/min/1.73 (ref >59)

## 2024-08-03 LAB — LIPID PANEL
Chol/HDL Ratio: 3.3 ratio (ref 0.0–5.0)
Cholesterol, Total: 177 mg/dL (ref 100–199)
HDL: 54 mg/dL (ref >39)
LDL Chol Calc (NIH): 114 mg/dL — ABNORMAL HIGH (ref 0–99)
Triglycerides: 42 mg/dL (ref 0–149)
VLDL Cholesterol Cal: 9 mg/dL (ref 5–40)

## 2024-08-05 ENCOUNTER — Ambulatory Visit: Payer: Self-pay | Admitting: Nurse Practitioner

## 2024-08-07 ENCOUNTER — Other Ambulatory Visit (HOSPITAL_COMMUNITY): Payer: Self-pay

## 2024-08-07 MED ORDER — AMPHETAMINE-DEXTROAMPHET ER 25 MG PO CP24
25.0000 mg | ORAL_CAPSULE | Freq: Every day | ORAL | 0 refills | Status: AC
  Filled 2024-08-30: qty 30, 30d supply, fill #0

## 2024-08-07 MED ORDER — AMPHETAMINE-DEXTROAMPHET ER 25 MG PO CP24
25.0000 mg | ORAL_CAPSULE | Freq: Every day | ORAL | 0 refills | Status: AC
  Filled 2024-09-30: qty 30, 30d supply, fill #0

## 2024-08-27 ENCOUNTER — Other Ambulatory Visit (HOSPITAL_COMMUNITY): Payer: Self-pay

## 2024-08-30 ENCOUNTER — Other Ambulatory Visit (HOSPITAL_COMMUNITY): Payer: Self-pay

## 2024-09-30 ENCOUNTER — Other Ambulatory Visit (HOSPITAL_COMMUNITY): Payer: Self-pay

## 2024-10-28 ENCOUNTER — Other Ambulatory Visit (HOSPITAL_COMMUNITY): Payer: Self-pay

## 2024-10-28 MED ORDER — AMPHETAMINE-DEXTROAMPHET ER 25 MG PO CP24
25.0000 mg | ORAL_CAPSULE | Freq: Every day | ORAL | 0 refills | Status: AC
  Filled 2024-10-28: qty 30, 30d supply, fill #0

## 2024-10-28 MED ORDER — AMPHETAMINE-DEXTROAMPHET ER 25 MG PO CP24
25.0000 mg | ORAL_CAPSULE | Freq: Every day | ORAL | 0 refills | Status: AC
  Filled 2024-12-09: qty 30, 30d supply, fill #0

## 2024-10-28 MED ORDER — AMPHETAMINE-DEXTROAMPHET ER 25 MG PO CP24: 25.0000 mg | ORAL_CAPSULE | ORAL | 0 refills | Status: AC

## 2024-12-09 ENCOUNTER — Other Ambulatory Visit (HOSPITAL_COMMUNITY): Payer: Self-pay
